# Patient Record
Sex: Female | Born: 1995 | Race: Black or African American | Hispanic: No | Marital: Single | Smoking: Never smoker
Health system: Southern US, Community
[De-identification: ages and names within clinical notes are randomized; demographics above are authoritative.]

## PROBLEM LIST (undated history)

## (undated) DIAGNOSIS — G43909 Migraine, unspecified, not intractable, without status migrainosus: Secondary | ICD-10-CM

## (undated) DIAGNOSIS — L709 Acne, unspecified: Secondary | ICD-10-CM

## (undated) DIAGNOSIS — R51 Headache: Secondary | ICD-10-CM

## (undated) DIAGNOSIS — L708 Other acne: Secondary | ICD-10-CM

## (undated) DIAGNOSIS — L309 Dermatitis, unspecified: Secondary | ICD-10-CM

## (undated) HISTORY — DX: Dermatitis, unspecified: L30.9

## (undated) HISTORY — DX: Headache: R51

## (undated) HISTORY — DX: Migraine, unspecified, not intractable, without status migrainosus: G43.909

## (undated) HISTORY — DX: Other acne: L70.8

## (undated) HISTORY — DX: Acne, unspecified: L70.9

## (undated) HISTORY — PX: NO PAST SURGERIES: SHX2092

---
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
## 2016-04-11 ENCOUNTER — Ambulatory Visit (INDEPENDENT_AMBULATORY_CARE_PROVIDER_SITE_OTHER): Payer: 59 | Admitting: Internal Medicine

## 2016-04-11 ENCOUNTER — Encounter: Payer: Self-pay | Admitting: Internal Medicine

## 2016-04-11 VITALS — BP 110/80 | HR 106 | Temp 97.9°F | Ht 66.82 in | Wt 217.8 lb

## 2016-04-11 DIAGNOSIS — R29898 Other symptoms and signs involving the musculoskeletal system: Secondary | ICD-10-CM | POA: Diagnosis not present

## 2016-04-11 DIAGNOSIS — M222X2 Patellofemoral disorders, left knee: Secondary | ICD-10-CM | POA: Diagnosis not present

## 2016-04-11 DIAGNOSIS — Z6834 Body mass index (BMI) 34.0-34.9, adult: Secondary | ICD-10-CM

## 2016-04-11 DIAGNOSIS — M25562 Pain in left knee: Secondary | ICD-10-CM

## 2016-04-11 NOTE — Patient Instructions (Signed)
Fortunately I do not think you have an internal ligament problem in the knee.  However I do think you have a patellofemoral tracking problem with possible disc with possible dislocation.  To prevent recurrent problems he may benefit from an exercise program and certain knee support.  For now use ice 10 minutes at night and then as needed  Can use ibuprofen 600 mg every 8 hours as needed for pain and swelling.  Avoid deep knee bends at this time.  Can try some gentle exercises as tolerated. If they hurt.  We'll do referral to sports medicine for the reasons discussed therapy and preventive exercise advice.    Patellar Dislocation and Subluxation, Phase I Rehab Ask your health care provider which exercises are safe for you. Do exercises exactly as told by your health care provider and adjust them as directed. It is normal to feel mild stretching, pulling, tightness, or discomfort as you do these exercises, but you should stop right away if you feel sudden pain or your pain gets worse.&nbsp;Do not begin these exercises until told by your health care provider. Stretching and range of motion exercises These exercises warm up your muscles and joints and improve the movement and flexibility of your knee. These exercises also help to relieve pain, numbness, and tingling. Exercise A: Knee flexion, active-assisted  2. Lie on your back with both knees straight. If this causes back discomfort, bend your healthy knee so your foot is flat on the floor. 3. Slowly slide your left / right heel back toward your buttocks as far as you can without feeling pain. 4. Hook your healthy leg to the top of your left / right shin and pull back to gently help your knee bend further. Do not force your knee to bend. 5. Hold for __________ seconds. 6. Slowly slide your left / right heel back to the starting position. Repeat __________ times. Complete this exercise __________ times a day. Strengthening  exercises These exercises build strength and endurance in your knee. Endurance is the ability to use your muscles for a long time, even after they get tired. If told by your health care provider, wear your brace while you do these exercises. Exercise B: Quadriceps, isometric   2. Lie on your back with your left / right leg extended and your other knee bent. If told by your health care provider, put a small pillow or rolled towel under your knee. 3. Slowly tense the muscles in the front of your left / right thigh. You should see your knee cap slide up toward your hip or see increased dimpling just above the knee. This motion will push the back of your knee toward the floor. 4. For ___________ seconds, hold the muscle as tight as you can without increasing your pain. 5. Relax the muscles slowly and completely. Repeat __________ times. Complete this exercise __________ times a day. Exercise C: Straight leg raises (  quadriceps) 2. Lie on your back with your left / right leg extended and your other knee bent. 3. Tense the muscles in the front of your left / right thigh. You should see your kneecap slide up or see increased dimpling just above your knee. Your thigh may even shake a bit. 4. Keep these muscles tight as you raise your leg 4-6 inches (10-15 cm) off the floor. 5. Hold for __________ seconds. 6. Keep these muscles tense as you lower your leg. 7. Relax the muscles slowly and completely. Repeat __________ times. Complete this  exercise __________ times a day. Exercise D: Straight leg raises ( hip abductors) 1. Lie on your side with your left / right leg in the top position. Lie so your head, shoulder, knee, and hip line up. You may bend your lower knee to help you keep your balance. 2. Roll your hips slightly forward so your hips are stacked directly over each other and your left / right knee is facing forward. 3. Leading with your heel, lift your top leg 4-6 inches (10-15 cm). You should  feel the muscles in your outer hip lifting.  Do not let your foot drift forward.  Do not let your knee roll toward the ceiling. 4. Hold this position for __________ seconds. 5. Slowly lower your leg to the starting position. 6. Let your muscles relax completely. Repeat __________ times. Complete this exercise __________ times a day. Exercise E: Straight leg raises ( hip extensors) 2. Lie on your abdomen on a firm surface. You can put a pillow under your hips if that is more comfortable. 3. Tense the muscles in your buttocks and lift your left / right leg about 4-6 inches (10-15 cm). Keep your knee straight as you lift your leg. 4. Hold this position for __________ seconds. 5. Slowly lower your leg to the starting position. 6. Let your leg relax completely. Repeat __________ times. Complete this exercise __________ times a day. Exercise F: Straight leg raises ( hip adductors) 2. Lie on your side with your left / right leg in the bottom position. Lie so your head, shoulder, knee, and hip line up. 3. Bend your top leg and place that foot in front of or behind your other leg to help you keep your balance. 4. Roll your hips slightly forward so your hips are stacked directly over each other and your knee is facing forward. 5. Tense the muscles in your inner thigh and lift your bottom leg 4-6 inches (10-15 cm). 6. Hold this position for __________ seconds. 7. Slowly lower your leg to the starting position. 8. Let your muscles relax completely. Repeat __________ times. Complete this exercise __________ times a day. This information is not intended to replace advice given to you by your health care provider. Make sure you discuss any questions you have with your health care provider. Document Released: 01/27/2005 Document Revised: 10/04/2015 Document Reviewed: 12/09/2014 Elsevier Interactive Patient Education  2017 [employer].

## 2016-04-11 NOTE — Progress Notes (Signed)
Chief Complaint  Patient presents with  . Knee Pain    HPI: Candice Wood 21 y.o.  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 Doesn't remember any specific injury but does do kneeling squatting and moving a lot. She had some discomfort in her left knee and then yesterday felt a pop over her left kneecap was used laying in bed and had to flip it back to normal. Become more sore and tender since that time. No previous history of injury and knee. No other treatment. ROS: See pertinent positives and negatives per HPI.  Past Medical History:  Diagnosis Date  . Acne   . ACNE 11/02/2007   Qualifier: Diagnosis of  By: Fabian Sharp MD, Neta Mends   . Eczema    neck  . Headache(784.0) 01/09/2010   Qualifier: Diagnosis of  By: Fabian Sharp MD, Neta Mends   . Migraine headache    eval peds neuro 2013 given zofran and  diary      Family History  Problem Relation Age of Onset  . Allergies Mother   . Migraines Father   . Asthma Paternal Grandmother   . Heart failure      pgm    Social History   Social History  . Marital status: Single    Spouse name: N/A  . Number of children: N/A  . Years of education: N/A   Social History Main Topics  . Smoking status: Never Smoker  . Smokeless tobacco: Never Used  . Alcohol use None  . Drug use: Unknown  . Sexual activity: Not Asked   Other Topics Concern  . None   Social History Narrative   Single   western  Ok in school    [employer] then wants to transfer.    2 households    No pets   Playing basketball school    Outpatient Medications Prior to Visit  Medication Sig Dispense Refill  . cyclobenzaprine (FLEXERIL) 5 MG tablet Take 1 tablet (5 mg total) by mouth at bedtime. For neck pain and headache 20 tablet 0  . levonorgestrel-ethinyl estradiol (AVIANE) 0.1-20 MG-MCG tablet Take 1 tablet by mouth daily. 1 Package 11  . naproxen (NAPROSYN) 500 MG tablet Take 1 po bid for headache with food 40 tablet 1   No  facility-administered medications prior to visit.      EXAM:  BP 110/80 (BP Location: Left Arm, Patient Position: Sitting, Cuff Size: Normal)   Pulse (!) 106   Temp 97.9 F (36.6 C) (Oral)   Ht 5' 6.82" (1.697 m)   Wt 217 lb 12.8 oz (98.8 kg)   BMI 34.30 kg/m   Body mass index is 34.3 kg/m.  GENERAL: vitals reviewed and listed above, alert, oriented, appears well hydrated and in no acute distress HEENT: atraumatic, conjunctiva  clear, no obvious abnormalities on inspection of external nose and ears   Examination of both knees left knee has some mild peripatellar effusion without warmth or crepitus. Tenderness in the medial patellar area range of motion normal negative drawer sign. Gait minimally antalgic. No posterior pain or joint line pain.    PSYCH: pleasant and cooperative, no obvious depression or anxiety Wt Readings from Last 3 Encounters:  04/11/16 217 lb 12.8 oz (98.8 kg) (99 %, Z= 2.18)*  11/01/15 211 lb 14.4 oz (96.1 kg) (98 %, Z= 2.11)*  09/29/14 192 lb (87.1 kg) (97 %, Z= 1.88)*   * Growth percentiles are based on  CDC 2-20 Years data.    ASSESSMENT AND PLAN:  Discussed the following assessment and plan:  Acute pain of left knee - Plan: Ambulatory referral to Sports Medicine  Patellofemoral pain syndrome of left knee - Plan: Ambulatory referral to Sports Medicine  Popping sound of knee joint - Plan: Ambulatory referral to Sports Medicine  BMI 34.0-34.9,adult May have had a mild patellar subluxation based on history. Discussed situation patellofemoral problem. Ice gentle exercises anti-inflammatory avoiding deep knee bends at this time we'll also do sports medicine referral for rehabilitation advice bracing etc. when she's working. Let us know if there are problems that arise in the interim.  Discussed also weight reduction. As would be helpful for her future knee and general health. health. -Patient advised to return or notify health care team  if symptoms  worsen ,persist or new concerns arise.  Patient Instructions   Fortunately I do not think you have an internal ligament problem in the knee.  However I do think you have a patellofemoral tracking problem with possible disc with possible dislocation.  To prevent recurrent problems he may benefit from an exercise program and certain knee support.  For now use ice 10 minutes at night and then as needed  Can use ibuprofen 600 mg every 8 hours as needed for pain and swelling.  Avoid deep knee bends at this time.  Can try some gentle exercises as tolerated. If they hurt.  We'll do referral to sports medicine for the reasons discussed therapy and preventive exercise advice.    Patellar Dislocation and Subluxation, Phase I Rehab Ask your health care provider which exercises are safe for you. Do exercises exactly as told by your health care provider and adjust them as directed. It is normal to feel mild stretching, pulling, tightness, or discomfort as you do these exercises, but you should stop right away if you feel sudden pain or your pain gets worse. Do not begin these exercises until told by your health care provider. Stretching and range of motion exercises These exercises warm up your muscles and joints and improve the movement and flexibility of your knee. These exercises also help to relieve pain, numbness, and tingling. Exercise A: Knee flexion, active-assisted  2. Lie on your back with both knees straight. If this causes back discomfort, bend your healthy knee so your foot is flat on the floor. 3. Slowly slide your left / right heel back toward your buttocks as far as you can without feeling pain. 4. Hook your healthy leg to the top of your left / right shin and pull back to gently help your knee bend further. Do not force your knee to bend. 5. Hold for __________ seconds. 6. Slowly slide your left / right heel back to the starting position. Repeat __________ times. Complete this  exercise __________ times a day. Strengthening exercises These exercises build strength and endurance in your knee. Endurance is the ability to use your muscles for a long time, even after they get tired. If told by your health care provider, wear your brace while you do these exercises. Exercise B: Quadriceps, isometric   2. Lie on your back with your left / right leg extended and your other knee bent. If told by your health care provider, put a small pillow or rolled towel under your knee. 3. Slowly tense the muscles in the front of your left / right thigh. You should see your knee cap slide up toward your hip or see increased dimpling just  above the knee. This motion will push the back of your knee toward the floor. 4. For ___________ seconds, hold the muscle as tight as you can without increasing your pain. 5. Relax the muscles slowly and completely. Repeat __________ times. Complete this exercise __________ times a day. Exercise C: Straight leg raises (  quadriceps) 2. Lie on your back with your left / right leg extended and your other knee bent. 3. Tense the muscles in the front of your left / right thigh. You should see your kneecap slide up or see increased dimpling just above your knee. Your thigh may even shake a bit. 4. Keep these muscles tight as you raise your leg 4-6 inches (10-15 cm) off the floor. 5. Hold for __________ seconds. 6. Keep these muscles tense as you lower your leg. 7. Relax the muscles slowly and completely. Repeat __________ times. Complete this exercise __________ times a day. Exercise D: Straight leg raises ( hip abductors) 1. Lie on your side with your left / right leg in the top position. Lie so your head, shoulder, knee, and hip line up. You may bend your lower knee to help you keep your balance. 2. Roll your hips slightly forward so your hips are stacked directly over each other and your left / right knee is facing forward. 3. Leading with your heel, lift  your top leg 4-6 inches (10-15 cm). You should feel the muscles in your outer hip lifting.  Do not let your foot drift forward.  Do not let your knee roll toward the ceiling. 4. Hold this position for __________ seconds. 5. Slowly lower your leg to the starting position. 6. Let your muscles relax completely. Repeat __________ times. Complete this exercise __________ times a day. Exercise E: Straight leg raises ( hip extensors) 2. Lie on your abdomen on a firm surface. You can put a pillow under your hips if that is more comfortable. 3. Tense the muscles in your buttocks and lift your left / right leg about 4-6 inches (10-15 cm). Keep your knee straight as you lift your leg. 4. Hold this position for __________ seconds. 5. Slowly lower your leg to the starting position. 6. Let your leg relax completely. Repeat __________ times. Complete this exercise __________ times a day. Exercise F: Straight leg raises ( hip adductors) 2. Lie on your side with your left / right leg in the bottom position. Lie so your head, shoulder, knee, and hip line up. 3. Bend your top leg and place that foot in front of or behind your other leg to help you keep your balance. 4. Roll your hips slightly forward so your hips are stacked directly over each other and your knee is facing forward. 5. Tense the muscles in your inner thigh and lift your bottom leg 4-6 inches (10-15 cm). 6. Hold this position for __________ seconds. 7. Slowly lower your leg to the starting position. 8. Let your muscles relax completely. Repeat __________ times. Complete this exercise __________ times a day. This information is not intended to replace advice given to you by your health care provider. Make sure you discuss any questions you have with your health care provider. Document Released: 01/27/2005 Document Revised: 10/04/2015 Document Reviewed: 12/09/2014 Elsevier Interactive Patient Education  2017 [employer]      Sand SpringsWanda K.  Arseniy Toomey M.D.

## 2016-04-23 NOTE — Progress Notes (Deleted)
Candice Wood D.O. [facility] [address] [place], [place] [address] Phone: [phone] Subjective:    I'm seeing this patient by the request  of:  Lorretta HarpPANOSH,WANDA KOTVAN, MD   CC: Left knee pain  BJY:NWGNFAOZHYHPI:Subjective  Candice Wood is a 21 y.o. female coming in with complaint of left knee pain. Patient's has started a new job in the last month. Patient does not have any true injury but does do much more active motion including squatting and kneeling moving a lot of heavier equipment. Patient states that she felt a pop over the left knee On the anterior aspect of the knee. Patient states this happen when she was laying in bed. Became more sore and tender over the course of time. Patient states     Past Medical History:  Diagnosis Date  . Acne   . ACNE 11/02/2007   Qualifier: Diagnosis of  By: Fabian SharpPanosh MD, Neta MendsWanda K   . Eczema    neck  . Headache(784.0) 01/09/2010   Qualifier: Diagnosis of  By: Fabian SharpPanosh MD, Neta MendsWanda K   . Migraine headache    eval peds neuro 2013 given zofran and  diary     Past Surgical History:  Procedure Laterality Date  . NO PAST SURGERIES     Social History   Social History  . Marital status: Single    Spouse name: N/A  . Number of children: N/A  . Years of education: N/A   Social History Main Topics  . Smoking status: Never Smoker  . Smokeless tobacco: Never Used  . Alcohol use Not on file  . Drug use: Unknown  . Sexual activity: Not on file   Other Topics Concern  . Not on file   Social History Narrative   Single   western  Ok in school    [employer] then wants to transfer.    2 households    No pets   Playing basketball school   No Known Allergies Family History  Problem Relation Age of Onset  . Allergies Mother   . Migraines Father   . Asthma Paternal Grandmother   . Heart failure      pgm    Past medical history, social, surgical and family history all reviewed in electronic medical record.  No pertanent information unless stated  regarding to the chief complaint.   Review of Systems:Review of systems updated and as accurate as of 04/23/16  No headache, visual changes, nausea, vomiting, diarrhea, constipation, dizziness, abdominal pain, skin rash, fevers, chills, night sweats, weight loss, swollen lymph nodes, body aches, joint swelling, muscle aches, chest pain, shortness of breath, mood changes.   Objective  There were no vitals taken for this visit. Systems examined below as of 04/23/16   General: No apparent distress alert and oriented x3 mood and affect normal, dressed appropriately.  HEENT: Pupils equal, extraocular movements intact  Respiratory: Patient's speak in full sentences and does not appear short of breath  Cardiovascular: No lower extremity edema, non tender, no erythema  Skin: Warm dry intact with no signs of infection or rash on extremities or on axial skeleton.  Abdomen: Soft nontender  Neuro: Cranial nerves II through XII are intact, neurovascularly intact in all extremities with 2+ DTRs and 2+ pulses.  Lymph: No lymphadenopathy of posterior or anterior cervical chain or axillae bilaterally.  Gait normal with good balance and coordination.  MSK:  Non tender with full range of motion and good stability and symmetric strength and tone of  shoulders, elbows, wrist, hip, and ankles bilaterally.  Knee:left  Normal to inspection with no erythema or effusion or obvious bony abnormalities. Palpation normal with no warmth, joint line tenderness, patellar tenderness, or condyle tenderness. ROM full in flexion and extension and lower leg rotation. Ligaments with solid consistent endpoints including ACL, PCL, LCL, MCL. Negative Mcmurray's, Apley's, and Thessalonian tests. Non painful patellar compression. Patellar glide without crepitus. Patellar and quadriceps tendons unremarkable. Hamstring and quadriceps strength is normal.   MSK US performed of: *** This study was ordered, performed, and interpreted  by Terrilee Files D.O.  Knee: All structures visualized. Anteromedial, anterolateral, posteromedial, and posterolateral menisci unremarkable without tearing, fraying, effusion, or displacement. Patellar Tendon unremarkable on long and transverse views without effusion. No abnormality of prepatellar bursa. LCL and MCL unremarkable on long and transverse views. No abnormality of origin of medial or lateral head of the gastrocnemius.  IMPRESSION:  NORMAL ULTRASONOGRAPHIC EXAMINATION OF THE KNEE.    Impression and Recommendations:     This case required medical decision making of moderate complexity.      Note: This dictation was prepared with Dragon dictation along with smaller phrase technology. Any transcriptional errors that result from this process are unintentional.

## 2016-04-24 ENCOUNTER — Ambulatory Visit: Payer: 59 | Admitting: Family Medicine

## 2016-06-04 ENCOUNTER — Ambulatory Visit (INDEPENDENT_AMBULATORY_CARE_PROVIDER_SITE_OTHER): Payer: 59 | Admitting: Internal Medicine

## 2016-06-04 ENCOUNTER — Encounter: Payer: Self-pay | Admitting: Emergency Medicine

## 2016-06-04 ENCOUNTER — Encounter: Payer: Self-pay | Admitting: Internal Medicine

## 2016-06-04 VITALS — BP 110/80 | HR 92 | Temp 99.7°F | Ht 66.82 in | Wt 216.7 lb

## 2016-06-04 DIAGNOSIS — J029 Acute pharyngitis, unspecified: Secondary | ICD-10-CM

## 2016-06-04 DIAGNOSIS — J989 Respiratory disorder, unspecified: Secondary | ICD-10-CM

## 2016-06-04 DIAGNOSIS — R509 Fever, unspecified: Secondary | ICD-10-CM | POA: Diagnosis not present

## 2016-06-04 LAB — POCT INFLUENZA A/B
INFLUENZA B, POC: NEGATIVE
Influenza A, POC: NEGATIVE

## 2016-06-04 LAB — POCT RAPID STREP A (OFFICE): RAPID STREP A SCREEN: NEGATIVE

## 2016-06-04 NOTE — Patient Instructions (Signed)
This is probabaly a virqal respiratory infection that should run its course  Ok to use advil tyleonol for fever   Fluids  And gargles  . Cold meds for comfort   feve should be gone in 48 hours    Rest  Better  After the weekend contact us if  persistent or progressive pain etc .  Will be informed about  Strep culture

## 2016-06-04 NOTE — Progress Notes (Signed)
Chief Complaint  Patient presents with  . Sore Throat    started yesterday/ tried nyquil   . Cough  . Nausea  . Headache    HPI: Candice Wood 21 y.o.  sda  Onset about 48 hours or less nausea and then sore throat different than what she's had like something in her throat hurts to swallow in the nasal congestion fever and chills. Took NyQuil last night has some type of cough not severe. No vomiting at the end of her period. No specific exposures to discussed flu or strep. No shortness of breath unusual rashes. ROS: See pertinent positives and negatives per HPI.  Past Medical History:  Diagnosis Date  . Acne   . ACNE 11/02/2007   Qualifier: Diagnosis of  By: Fabian Sharp MD, Neta Mends   . Eczema    neck  . Headache(784.0) 01/09/2010   Qualifier: Diagnosis of  By: Fabian Sharp MD, Neta Mends   . Migraine headache    eval peds neuro 2013 given zofran and  diary      Family History  Problem Relation Age of Onset  . Allergies Mother   . Migraines Father   . Asthma Paternal Grandmother   . Heart failure      pgm    Social History   Social History  . Marital status: Single    Spouse name: N/A  . Number of children: N/A  . Years of education: N/A   Social History Main Topics  . Smoking status: Never Smoker  . Smokeless tobacco: Never Used  . Alcohol use None  . Drug use: Unknown  . Sexual activity: Not Asked   Other Topics Concern  . None   Social History Narrative   Single   western  Ok in school    [employer] then wants to transfer.    2 households    No pets   Playing basketball school    Outpatient Medications Prior to Visit  Medication Sig Dispense Refill  . levonorgestrel-ethinyl estradiol (AVIANE) 0.1-20 MG-MCG tablet Take 1 tablet by mouth daily. 1 Package 11  . cyclobenzaprine (FLEXERIL) 5 MG tablet Take 1 tablet (5 mg total) by mouth at bedtime. For neck pain and headache 20 tablet 0  . naproxen (NAPROSYN) 500 MG tablet Take 1 po bid for headache with food 40  tablet 1   No facility-administered medications prior to visit.      EXAM:  BP 110/80 (BP Location: Right Arm, Patient Position: Sitting, Cuff Size: Normal)   Pulse 92   Temp 99.7 F (37.6 C) (Oral)   Ht 5' 6.82" (1.697 m)   Wt 216 lb 11.2 oz (98.3 kg)   SpO2 98%   BMI 34.12 kg/m   Body mass index is 34.12 kg/m.  GENERAL: vitals reviewed and listed above, alert, oriented, appears well hydrated and in no acute distress very congested non toxic     HEENT: atraumatic, conjunctiva  clear, no obvious abnormalities on inspection of external nose and ears tm right pink slightly dull  Left nl  Nares boggy face tender bifrontal  OP : no lesion edema or exudate  2+ red  NECK: no obvious masses on inspection palpation  Tender ac nodes neg pc  LUNGS: clear to auscultation bilaterally, no wheezes, rales or rhonchi, good air movement CV: HRRR, no clubbing cyanosis or  peripheral edema nl cap refill  MS: moves all extremities without noticeable focal  abnormality PSYCH: pleasant and cooperative, no obvious depression or anxiety  ASSESSMENT AND PLAN:  Discussed the following assessment and plan:  Acute pharyngitis, unspecified etiology - Plan: POCT Influenza A/B, POCT rapid strep A, Culture, Group A Strep  Febrile respiratory illness - Plan: POCT Influenza A/B, POCT rapid strep A, Culture, Group A Strep  -Patient advised to return or notify health care team  if symptoms worsen ,persist or new concerns arise.  Patient Instructions  This is probabaly a virqal respiratory infection that should run its course  Ok to use advil tyleonol for fever   Fluids  And gargles  . Cold meds for comfort   feve should be gone in 48 hours    Rest  Better  After the weekend contact us if  persistent or progressive pain etc .  Will be informed about  Strep culture     Neta Mends. Panosh M.D.

## 2016-06-06 LAB — CULTURE, GROUP A STREP

## 2016-06-10 ENCOUNTER — Encounter: Payer: Self-pay | Admitting: Family Medicine

## 2017-01-17 ENCOUNTER — Other Ambulatory Visit: Payer: Self-pay | Admitting: Internal Medicine

## 2017-02-17 NOTE — Progress Notes (Signed)
Chief Complaint  Patient presents with  . Annual Exam    No new concerns. restart BC    HPI: Patient  Candice Wood  22 y.o. comes in today for Preventive Health Care visit   Stopped temp to see  If   weight related .    Since summer.   But wants to go back on ocps for period reg and contraception  Uses condoms  No sx  gu  Beginning more exercises      Health Maintenance  Topic Date Due  . HIV Screening  11/18/2011  . INFLUENZA VACCINE  10/22/2017 (Originally 09/10/2016)  . TETANUS/TDAP  08/19/2017   Health Maintenance Review LIFESTYLE:  Exercise:   Working out .  Tobacco/ETS: no Alcohol:   no Sugar beverages: Sleep: ave 6 hours  Drug use: no   HH of   3  No pets  Work:/school  Bed  [place]  .  25  30  No sig depression Period on now .  Monthly 7 days.     ROS:  GEN/ HEENT: No fever, significant weight changes sweats headaches vision problems hearing changes, CV/ PULM; No chest pain shortness of breath cough, syncope,edema  change in exercise tolerance. GI /GU: No adominal pain, vomiting, change in bowel habits. No blood in the stool. No significant GU symptoms. SKIN/HEME: ,no acute skin rashes suspicious lesions or bleeding. No lymphadenopathy, nodules, masses.  NEURO/ PSYCH:  No neurologic signs such as weakness numbness. No depression anxiety. IMM/ Allergy: No unusual infections.  Allergy .   REST of 12 system review negative except as per HPI   Past Medical History:  Diagnosis Date  . Acne   . ACNE 11/02/2007   Qualifier: Diagnosis of  By: Regis Bill MD, Standley Brooking   . Eczema    neck  . Headache(784.0) 01/09/2010   Qualifier: Diagnosis of  By: Regis Bill MD, Standley Brooking   . Migraine headache    eval peds neuro 2013 given zofran and  diary      Past Surgical History:  Procedure Laterality Date  . NO PAST SURGERIES      Family History  Problem Relation Age of Onset  . Allergies Mother   . Migraines Father   . Asthma Paternal Grandmother   . Heart  failure Unknown        pgm    Social History   Socioeconomic History  . Marital status: Single    Spouse name: Not on file  . Number of children: Not on file  . Years of education: Not on file  . Highest education level: Not on file  Social Needs  . Financial resource strain: Not on file  . Food insecurity - worry: Not on file  . Food insecurity - inability: Not on file  . Transportation needs - medical: Not on file  . Transportation needs - non-medical: Not on file  Occupational History  . Not on file  Tobacco Use  . Smoking status: Never Smoker  . Smokeless tobacco: Never Used  Substance and Sexual Activity  . Alcohol use: Not on file  . Drug use: Not on file  . Sexual activity: Not on file  Other Topics Concern  . Not on file  Social History Narrative   Single   [place] in school    [employer] then wants to transfer.    2 households    No pets   Playing basketball school    Outpatient Medications Prior  to Visit  Medication Sig Dispense Refill  . levonorgestrel-ethinyl estradiol (SRONYX) 0.1-20 MG-MCG tablet Take 1 tablet by mouth daily. NEED OFFICE VISIT FOR FURTHER REFILLS. (Patient not taking: Reported on 02/19/2017) 28 tablet 1   No facility-administered medications prior to visit.      EXAM:  BP 108/62 (BP Location: Right Arm, Patient Position: Sitting, Cuff Size: Normal)   Pulse 90   Temp 98.2 F (36.8 C) (Oral)   Ht 5' 5.5" (1.664 m)   Wt 220 lb 14.4 oz (100.2 kg)   BMI 36.20 kg/m   Body mass index is 36.2 kg/m. Wt Readings from Last 3 Encounters:  02/19/17 220 lb 14.4 oz (100.2 kg)  06/04/16 216 lb 11.2 oz (98.3 kg) (98 %, Z= 2.17)*  04/11/16 217 lb 12.8 oz (98.8 kg) (99 %, Z= 2.18)*   * Growth percentiles are based on CDC (Girls, 2-20 Years) data.    Physical Exam: Vital signs reviewed MIW:OEHO is a well-developed well-nourished alert cooperative    who appearsr stated age in no acute distress.  HEENT: normocephalic atraumatic , Eyes:  PERRL EOM's full, conjunctiva clear, Nares: paten,t no deformity discharge or tenderness., Ears: no deformity EAC's clear TMs with normal landmarks. Mouth: clear OP, no lesions, edema.  Moist mucous membranes. Dentition in adequate repair. NECK: supple without masses, thyromegaly or bruits. CHEST/PULM:  Clear to auscultation and percussion breath sounds equal no wheeze , rales or rhonchi. No chest wall deformities or tenderness. Breast: normal by inspection . No dimpling, discharge, masses, tenderness or discharge . CV: PMI is nondisplaced, S1 S2 no gallops, murmurs, rubs. Peripheral pulses are full without delay.No JVD .  ABDOMEN: Bowel sounds normal nontender  No guard or rebound, no hepato splenomegal no CVA tenderness.  No hernia. Extremtities:  No clubbing cyanosis or edema, no acute joint swelling or redness no focal atrophy NEURO:  Oriented x3, cranial nerves 3-12 appear to be intact, no obvious focal weakness,gait within normal limits no abnormal reflexes or asymmetrical SKIN: No acute rashes normal turgor, color, no bruising or petechiae. PSYCH: Oriented, good eye contact, no obvious depression anxiety, cognition and judgment appear normal. LN: no cervical axillary inguinal adenopathy    BP Readings from Last 3 Encounters:  02/19/17 108/62  06/04/16 110/80 (32 %, Z = -0.47 /  93 %, Z = 1.48)*  04/11/16 110/80 (33 %, Z = -0.43 /  93 %, Z = 1.47)*   *BP percentiles are based on the August 2017 AAP Clinical Practice Guideline for girls    Lab  Orders   sti screen etc   ASSESSMENT AND PLAN:  Discussed the following assessment and plan:  Visit for preventive health examination - Plan: Basic metabolic panel, CBC with Differential/Platelet, Hepatic function panel, Lipid panel, TSH, RPR, HIV antibody, C. trachomatis/N. gonorrhoeae RNA, CANCELED: GC/Chlamydia Probe Amp  OCP (oral contraceptive pills) initiation - acceptable candidate  to restart  - Plan: Basic metabolic panel, CBC with  Differential/Platelet, Hepatic function panel, Lipid panel, TSH, RPR, HIV antibody, C. trachomatis/N. gonorrhoeae RNA, CANCELED: GC/Chlamydia Probe Amp  Routine screening for STI (sexually transmitted infection) - Plan: Basic metabolic panel, CBC with Differential/Platelet, Hepatic function panel, Lipid panel, TSH, RPR, HIV antibody, C. trachomatis/N. gonorrhoeae RNA, CANCELED: GC/Chlamydia Probe Amp  Need for HPV vaccination - Plan: HPV 9-valent vaccine,Recombinat disc prevention   See below  Patient Care Team: Burnis Medin, MD as PCP - General Patient Instructions  Consider weight wtachers  As discussed o nline or meetings.  Begin  ocps   Will notify you  of labs when available.   rov in 4 months  For 3rd HPV  And Bo ocp check      Preventive Care for [place], Female The transition to life after high school as a young adult can be a stressful time with many changes. You may start seeing a primary care physician instead of a pediatrician. This is the time when your health care becomes your responsibility. Preventive care refers to lifestyle choices and visits with your health care provider that can promote health and wellness. What does preventive care include?  A yearly physical exam. This is also called an annual wellness visit.  Dental exams once or twice a year.  Routine eye exams. Ask your health care provider how often you should have your eyes checked.  Personal lifestyle choices, including: ? Daily care of your teeth and gums. ? Regular physical activity. ? Eating a healthy diet. ? Avoiding tobacco and drug use. ? Avoiding or limiting alcohol use. ? Practicing safe sex. ? Taking vitamin and mineral supplements as recommended by your health care provider. What happens during an annual wellness visit? Preventive care starts with a yearly visit to your primary care physician. The services and screenings done by your health care provider during your annual wellness  visit will depend on your overall health, lifestyle risk factors, and family history of disease. Counseling Your health care provider may ask you questions about:  Past medical problems and your family's medical history.  Medicines or supplements you take.  Health insurance and access to health care.  Alcohol, tobacco, and drug use.  Your safety at home, work, or school.  Access to firearms.  Emotional well-being and how you cope with stress.  Relationship well-being.  Diet, exercise, and sleep habits.  Your sexual health and activity.  Your methods of birth control.  Your menstrual cycle.  Your pregnancy history.  Screening You may have the following tests or measurements:  Height, weight, and BMI.  Blood pressure.  Lipid and cholesterol levels.  Tuberculosis skin test.  Skin exam.  Vision and hearing tests.  Screening test for hepatitis.  Screening tests for sexually transmitted diseases (STDs), if you are at risk.  BRCA-related cancer screening. This may be done if you have a family history of breast, ovarian, tubal, or peritoneal cancers.  Pelvic exam and Pap test. This may be done every 3 years starting at age 37.  Vaccines Your health care provider may recommend certain vaccines, such as:  Influenza vaccine. This is recommended every year.  Tetanus, diphtheria, and acellular pertussis (Tdap, Td) vaccine. You may need a Td booster every 10 years.  Varicella vaccine. You may need this if you have not been vaccinated.  HPV vaccine. If you are 29 or younger, you may need three doses over 6 months.  Measles, mumps, and rubella (MMR) vaccine. You may need at least one dose of MMR. You may also need a second dose.  Pneumococcal 13-valent conjugate (PCV13) vaccine. You may need this if you have certain conditions and were not previously vaccinated.  Pneumococcal polysaccharide (PPSV23) vaccine. You may need one or two doses if you smoke cigarettes or  if you have certain conditions.  Meningococcal vaccine. One dose is recommended if you are age 21-21 years and a first-year college student living in a residence hall, or if you have one of several medical conditions. You may also need additional booster doses.  Hepatitis A vaccine. You may  need this if you have certain conditions or if you travel or work in places where you may be exposed to hepatitis A.  Hepatitis B vaccine. You may need this if you have certain conditions or if you travel or work in places where you may be exposed to hepatitis B.  Haemophilus influenzae type b (Hib) vaccine. You may need this if you have certain risk factors.  Talk to your health care provider about which screenings and vaccines you need and how often you need them. What steps can I take to develop healthy behaviors?  Have regular preventive health care visits with your primary care physician and dentist.  Eat a healthy diet.  Drink enough fluid to keep your urine clear or pale yellow.  Stay active. Exercise at least 30 minutes 5 or more days of the week.  Use alcohol responsibly.  Maintain a healthy weight.  Do not use any products that contain nicotine, such as cigarettes, chewing tobacco, and e-cigarettes. If you need help quitting, ask your health care provider.  Do not use drugs.  Practice safe sex.  Use birth control (contraception) to prevent unwanted pregnancy. If you plan to become pregnant, see your health care provider for a pre-conception visit.  Find healthy ways to manage stress. How can I protect myself from injury? Injuries from violence or accidents are the leading cause of death among young adults and can often be prevented. Take these steps to help protect yourself:  Always wear your seat belt while driving or riding in a vehicle.  Do not drive if you have been drinking alcohol. Do not ride with someone who has been drinking.  Do not drive when you are tired or  distracted. Do not text while driving.  Wear a helmet and other protective equipment during sports activities.  If you have firearms in your house, make sure you follow all gun safety procedures.  Seek help if you have been bullied, physically abused, or sexually abused.  Use the Internet responsibly to avoid dangers such as online bullying and online sexual predators.  What can I do to cope with stress? Young adults may face many new challenges that can be stressful, such as finding a job, going to college, moving away from home, managing money, being in a relationship, getting married, and having children. To manage stress:  Avoid known stressful situations when you can.  Exercise regularly.  Find a stress-reducing activity that works best for you. Examples include meditation, yoga, listening to music, or reading.  Spend time in nature.  Keep a journal to write about your stress and how you respond.  Talk to your health care provider about stress. He or she may suggest counseling.  Spend time with supportive friends or family.  Do not cope with stress by: ? Drinking alcohol or using drugs. ? Smoking cigarettes. ? Eating.  Where can I get more information? Learn more about preventive care and healthy habits from:  [place] and Gynecologists: KaraokeExchange.nl  U.S. Probation officer Task Force: StageSync.si  National Adolescent and [place]: StrategicRoad.nl  American Academy of Pediatrics Bright Futures: https://brightfutures.MemberVerification.co.za  Society for Adolescent Health and Medicine: MoralBlog.co.za.aspx  PodExchange.nl: ToyLending.fr  This information is not intended to replace advice given to you  by your health care provider. Make sure you discuss any questions you have with your health care provider. Document Released: 06/14/2015 Document Revised: 07/05/2015 Document Reviewed: 06/14/2015 Elsevier Interactive Patient Education  [employer].  Standley Brooking. Aiko Belko M.D.

## 2017-02-19 ENCOUNTER — Encounter: Payer: Self-pay | Admitting: Internal Medicine

## 2017-02-19 ENCOUNTER — Ambulatory Visit (INDEPENDENT_AMBULATORY_CARE_PROVIDER_SITE_OTHER): Payer: 59 | Admitting: Internal Medicine

## 2017-02-19 VITALS — BP 108/62 | HR 90 | Temp 98.2°F | Ht 65.5 in | Wt 220.9 lb

## 2017-02-19 DIAGNOSIS — Z23 Encounter for immunization: Secondary | ICD-10-CM

## 2017-02-19 DIAGNOSIS — Z113 Encounter for screening for infections with a predominantly sexual mode of transmission: Secondary | ICD-10-CM | POA: Diagnosis not present

## 2017-02-19 DIAGNOSIS — Z30011 Encounter for initial prescription of contraceptive pills: Secondary | ICD-10-CM | POA: Diagnosis not present

## 2017-02-19 DIAGNOSIS — Z0000 Encounter for general adult medical examination without abnormal findings: Secondary | ICD-10-CM | POA: Diagnosis not present

## 2017-02-19 LAB — CBC WITH DIFFERENTIAL/PLATELET
BASOS ABS: 0 10*3/uL (ref 0.0–0.1)
Basophils Relative: 0.4 % (ref 0.0–3.0)
EOS PCT: 1.5 % (ref 0.0–5.0)
Eosinophils Absolute: 0.1 10*3/uL (ref 0.0–0.7)
HEMATOCRIT: 39.2 % (ref 36.0–46.0)
Hemoglobin: 12.5 g/dL (ref 12.0–15.0)
LYMPHS ABS: 1.7 10*3/uL (ref 0.7–4.0)
LYMPHS PCT: 19.5 % (ref 12.0–46.0)
MCHC: 31.9 g/dL (ref 30.0–36.0)
MCV: 84.8 fl (ref 78.0–100.0)
MONOS PCT: 10.1 % (ref 3.0–12.0)
Monocytes Absolute: 0.9 10*3/uL (ref 0.1–1.0)
NEUTROS ABS: 5.8 10*3/uL (ref 1.4–7.7)
NEUTROS PCT: 68.5 % (ref 43.0–77.0)
PLATELETS: 239 10*3/uL (ref 150.0–400.0)
RBC: 4.62 Mil/uL (ref 3.87–5.11)
RDW: 14.2 % (ref 11.5–14.6)
WBC: 8.5 10*3/uL (ref 4.5–10.5)

## 2017-02-19 LAB — BASIC METABOLIC PANEL
BUN: 11 mg/dL (ref 6–23)
CO2: 27 meq/L (ref 19–32)
Calcium: 9.1 mg/dL (ref 8.4–10.5)
Chloride: 105 mEq/L (ref 96–112)
Creatinine, Ser: 0.76 mg/dL (ref 0.40–1.20)
GFR: 124.45 mL/min (ref >60.00)
GLUCOSE: 91 mg/dL (ref 70–99)
Potassium: 3.9 mEq/L (ref 3.5–5.1)
SODIUM: 139 meq/L (ref 135–145)

## 2017-02-19 LAB — HEPATIC FUNCTION PANEL
ALBUMIN: 4.1 g/dL (ref 3.5–5.2)
ALK PHOS: 51 U/L (ref 39–117)
ALT: 9 U/L (ref 0–35)
AST: 11 U/L (ref 0–37)
Bilirubin, Direct: 0.1 mg/dL (ref 0.0–0.3)
TOTAL PROTEIN: 6.8 g/dL (ref 6.0–8.3)
Total Bilirubin: 0.5 mg/dL (ref 0.2–1.2)

## 2017-02-19 LAB — LIPID PANEL
CHOLESTEROL: 131 mg/dL (ref 0–200)
HDL: 50.8 mg/dL (ref >39.00)
LDL Cholesterol: 72 mg/dL (ref 0–99)
NONHDL: 80.61
Total CHOL/HDL Ratio: 3
Triglycerides: 44 mg/dL (ref 0.0–149.0)
VLDL: 8.8 mg/dL (ref 0.0–40.0)

## 2017-02-19 LAB — TSH: TSH: 2.47 u[IU]/mL (ref 0.35–5.50)

## 2017-02-19 MED ORDER — DESOGESTREL-ETHINYL ESTRADIOL 0.15-30 MG-MCG PO TABS: 1.0000 | ORAL_TABLET | Freq: Every day | ORAL | 11 refills | Status: DC

## 2017-02-19 NOTE — Patient Instructions (Signed)
Consider weight wtachers  As discussed o nline or meetings.  Begin ocps   Will notify you  of labs when available.   rov in 4 months  For 3rd HPV  And Bo ocp check      Preventive Care for Young Adults, Female The transition to life after high school as a young adult can be a stressful time with many changes. You may start seeing a primary care physician instead of a pediatrician. This is the time when your health care becomes your responsibility. Preventive care refers to lifestyle choices and visits with your health care provider that can promote health and wellness. What does preventive care include?  A yearly physical exam. This is also called an annual wellness visit.  Dental exams once or twice a year.  Routine eye exams. Ask your health care provider how often you should have your eyes checked.  Personal lifestyle choices, including: ? Daily care of your teeth and gums. ? Regular physical activity. ? Eating a healthy diet. ? Avoiding tobacco and drug use. ? Avoiding or limiting alcohol use. ? Practicing safe sex. ? Taking vitamin and mineral supplements as recommended by your health care provider. What happens during an annual wellness visit? Preventive care starts with a yearly visit to your primary care physician. The services and screenings done by your health care provider during your annual wellness visit will depend on your overall health, lifestyle risk factors, and family history of disease. Counseling Your health care provider may ask you questions about:  Past medical problems and your family's medical history.  Medicines or supplements you take.  Health insurance and access to health care.  Alcohol, tobacco, and drug use.  Your safety at home, work, or school.  Access to firearms.  Emotional well-being and how you cope with stress.  Relationship well-being.  Diet, exercise, and sleep habits.  Your sexual health and activity.  Your methods of birth  control.  Your menstrual cycle.  Your pregnancy history.  Screening You may have the following tests or measurements:  Height, weight, and BMI.  Blood pressure.  Lipid and cholesterol levels.  Tuberculosis skin test.  Skin exam.  Vision and hearing tests.  Screening test for hepatitis.  Screening tests for sexually transmitted diseases (STDs), if you are at risk.  BRCA-related cancer screening. This may be done if you have a family history of breast, ovarian, tubal, or peritoneal cancers.  Pelvic exam and Pap test. This may be done every 3 years starting at age 21.  Vaccines Your health care provider may recommend certain vaccines, such as:  Influenza vaccine. This is recommended every year.  Tetanus, diphtheria, and acellular pertussis (Tdap, Td) vaccine. You may need a Td booster every 10 years.  Varicella vaccine. You may need this if you have not been vaccinated.  HPV vaccine. If you are 26 or younger, you may need three doses over 6 months.  Measles, mumps, and rubella (MMR) vaccine. You may need at least one dose of MMR. You may also need a second dose.  Pneumococcal 13-valent conjugate (PCV13) vaccine. You may need this if you have certain conditions and were not previously vaccinated.  Pneumococcal polysaccharide (PPSV23) vaccine. You may need one or two doses if you smoke cigarettes or if you have certain conditions.  Meningococcal vaccine. One dose is recommended if you are age 19-21 years and a first-year college student living in a residence hall, or if you have one of several medical conditions. You may   also need additional booster doses.  Hepatitis A vaccine. You may need this if you have certain conditions or if you travel or work in places where you may be exposed to hepatitis A.  Hepatitis B vaccine. You may need this if you have certain conditions or if you travel or work in places where you may be exposed to hepatitis B.  Haemophilus influenzae  type b (Hib) vaccine. You may need this if you have certain risk factors.  Talk to your health care provider about which screenings and vaccines you need and how often you need them. What steps can I take to develop healthy behaviors?  Have regular preventive health care visits with your primary care physician and dentist.  Eat a healthy diet.  Drink enough fluid to keep your urine clear or pale yellow.  Stay active. Exercise at least 30 minutes 5 or more days of the week.  Use alcohol responsibly.  Maintain a healthy weight.  Do not use any products that contain nicotine, such as cigarettes, chewing tobacco, and e-cigarettes. If you need help quitting, ask your health care provider.  Do not use drugs.  Practice safe sex.  Use birth control (contraception) to prevent unwanted pregnancy. If you plan to become pregnant, see your health care provider for a pre-conception visit.  Find healthy ways to manage stress. How can I protect myself from injury? Injuries from violence or accidents are the leading cause of death among young adults and can often be prevented. Take these steps to help protect yourself:  Always wear your seat belt while driving or riding in a vehicle.  Do not drive if you have been drinking alcohol. Do not ride with someone who has been drinking.  Do not drive when you are tired or distracted. Do not text while driving.  Wear a helmet and other protective equipment during sports activities.  If you have firearms in your house, make sure you follow all gun safety procedures.  Seek help if you have been bullied, physically abused, or sexually abused.  Use the Internet responsibly to avoid dangers such as online bullying and online sexual predators.  What can I do to cope with stress? Young adults may face many new challenges that can be stressful, such as finding a job, going to college, moving away from home, managing money, being in a relationship, getting  married, and having children. To manage stress:  Avoid known stressful situations when you can.  Exercise regularly.  Find a stress-reducing activity that works best for you. Examples include meditation, yoga, listening to music, or reading.  Spend time in nature.  Keep a journal to write about your stress and how you respond.  Talk to your health care provider about stress. He or she may suggest counseling.  Spend time with supportive friends or family.  Do not cope with stress by: ? Drinking alcohol or using drugs. ? Smoking cigarettes. ? Eating.  Where can I get more information? Learn more about preventive care and healthy habits from:  [place] and Gynecologists: KaraokeExchange.nl  U.S. Probation officer Task Force: StageSync.si  National Adolescent and [place]: StrategicRoad.nl  American Academy of Pediatrics Bright Futures: https://brightfutures.MemberVerification.co.za  Society for Adolescent Health and Medicine: MoralBlog.co.za.aspx  PodExchange.nl: ToyLending.fr  This information is not intended to replace advice given to you by your health care provider. Make sure you discuss any questions you have with your health care provider. Document Released: 06/14/2015 Document Revised: 07/05/2015 Document  Reviewed: 06/14/2015 Elsevier Interactive Patient Education  2018 Elsevier Inc.  

## 2017-02-20 LAB — C. TRACHOMATIS/N. GONORRHOEAE RNA
C. TRACHOMATIS RNA, TMA: NOT DETECTED
N. gonorrhoeae RNA, TMA: NOT DETECTED

## 2017-02-20 LAB — HIV ANTIBODY (ROUTINE TESTING W REFLEX): HIV 1&2 Ab, 4th Generation: NONREACTIVE

## 2017-02-20 LAB — RPR: RPR Ser Ql: NONREACTIVE

## 2018-01-29 NOTE — Progress Notes (Signed)
No chief complaint on file.   HPI: Candice Wood 22 y.o. come in for   Problem based visit   Rash   Was at work [employer]  and after scrathing face had some  Whelp marks noted   Hx of  Itching  Area around skin. Washes and some ponds moisturizer  And sometimes  [place].  Not getting better  No exposures pets tinea  Last ov was PV jan 2019    Hx of eczema as a child  ROS: See pertinent positives and negatives per HPI. No other rashes  Does tend to have whelps when scratches otherwise   Past Medical History:  Diagnosis Date  . Acne   . ACNE 11/02/2007   Qualifier: Diagnosis of  By: Fabian SharpPanosh MD, Neta MendsWanda K   . Eczema    neck  . Headache(784.0) 01/09/2010   Qualifier: Diagnosis of  By: Fabian SharpPanosh MD, Neta MendsWanda K   . Migraine headache    eval peds neuro 2013 given zofran and  diary      Family History  Problem Relation Age of Onset  . Allergies Mother   . Migraines Father   . Asthma Paternal Grandmother   . Heart failure Unknown        pgm    Social History   Socioeconomic History  . Marital status: Single    Spouse name: Not on file  . Number of children: Not on file  . Years of education: Not on file  . Highest education level: Not on file  Occupational History  . Not on file  Social Needs  . Financial resource strain: Not on file  . Food insecurity:    Worry: Not on file    Inability: Not on file  . Transportation needs:    Medical: Not on file    Non-medical: Not on file  Tobacco Use  . Smoking status: Never Smoker  . Smokeless tobacco: Never Used  Substance and Sexual Activity  . Alcohol use: Not on file  . Drug use: Not on file  . Sexual activity: Not on file  Lifestyle  . Physical activity:    Days per week: Not on file    Minutes per session: Not on file  . Stress: Not on file  Relationships  . Social connections:    Talks on phone: Not on file    Gets together: Not on file    Attends religious service: Not on file    Active member of  club or organization: Not on file    Attends meetings of clubs or organizations: Not on file    Relationship status: Not on file  Other Topics Concern  . Not on file  Social History Narrative   Single   western  Ok in school    [employer] then wants to transfer.    2 households    No pets   Playing basketball school    Outpatient Medications Prior to Visit  Medication Sig Dispense Refill  . desogestrel-ethinyl estradiol (APRI,EMOQUETTE,SOLIA) 0.15-30 MG-MCG tablet Take 1 tablet by mouth daily. 1 Package 11  . levonorgestrel-ethinyl estradiol (SRONYX) 0.1-20 MG-MCG tablet Take 1 tablet by mouth daily. NEED OFFICE VISIT FOR FURTHER REFILLS. 28 tablet 1   No facility-administered medications prior to visit.      EXAM:  BP 108/70 (BP Location: Left Arm, Patient Position: Sitting, Cuff Size: Large)   Pulse 74   Temp 98 F (36.7 C)   Wt 222 lb 3.2  oz (100.8 kg)   SpO2 98%   BMI 36.41 kg/m   Body mass index is 36.41 kg/m.  GENERAL: vitals reviewed and listed above, alert, oriented, appears well hydrated and in no acute distress HEENT: atraumatic, conjunctiva  clear, no obvious abnormalities on inspection of external nose and ears  NECK: no obvious masses on inspection palpation  Skin  Left  Lower chin with 3 cm plaque hyperpigmented thickened with a few follicles noted  And indestinct  Margins   No central  clearing  Art nails   Hands clear  PSYCH: pleasant and cooperative, no obvious depression or anxiety  BP Readings from Last 3 Encounters:  02/01/18 108/70  02/19/17 108/62  06/04/16 110/80   Wt Readings from Last 3 Encounters:  02/01/18 222 lb 3.2 oz (100.8 kg)  02/19/17 220 lb 14.4 oz (100.2 kg)  06/04/16 216 lb 11.2 oz (98.3 kg) (98 %, Z= 2.17)*   * Growth percentiles are based on CDC (Girls, 2-20 Years) data.    ASSESSMENT AND PLAN:  Discussed the following assessment and plan:  Pruritic rash - see text poss eczema but atypical  try mild steroid   first  Influenza vaccination declined by patient Declined lfu vaccine -Patient advised to return or notify health care team  if  new concerns arise.  Patient Instructions  Treating this like an eczema patch   Topical  Steroid twice a day for up  To 2 weeks  I f not improving in 2 weeks then   Plan followup. We can consider seeing dermatologist if needed.   Avoid scratching   Cool compresses if needed.      Neta MendsWanda K. Leilanee Righetti M.D.

## 2018-02-01 ENCOUNTER — Encounter: Payer: Self-pay | Admitting: Internal Medicine

## 2018-02-01 ENCOUNTER — Ambulatory Visit: Payer: 59 | Admitting: Internal Medicine

## 2018-02-01 VITALS — BP 108/70 | HR 74 | Temp 98.0°F | Wt 222.2 lb

## 2018-02-01 DIAGNOSIS — Z2821 Immunization not carried out because of patient refusal: Secondary | ICD-10-CM | POA: Diagnosis not present

## 2018-02-01 DIAGNOSIS — L282 Other prurigo: Secondary | ICD-10-CM

## 2018-02-01 MED ORDER — DESONIDE 0.05 % EX CREA: TOPICAL_CREAM | Freq: Two times a day (BID) | CUTANEOUS | 1 refills | Status: AC

## 2018-02-01 NOTE — Patient Instructions (Signed)
Treating this like an eczema patch   Topical  Steroid twice a day for up  To 2 weeks  I f not improving in 2 weeks then   Plan followup. We can consider seeing dermatologist if needed.   Avoid scratching   Cool compresses if needed.

## 2018-08-27 ENCOUNTER — Ambulatory Visit (INDEPENDENT_AMBULATORY_CARE_PROVIDER_SITE_OTHER): Payer: 59 | Admitting: Internal Medicine

## 2018-08-27 ENCOUNTER — Encounter: Payer: Self-pay | Admitting: Internal Medicine

## 2018-08-27 DIAGNOSIS — F418 Other specified anxiety disorders: Secondary | ICD-10-CM

## 2018-08-27 DIAGNOSIS — Z0289 Encounter for other administrative examinations: Secondary | ICD-10-CM

## 2018-08-27 NOTE — Patient Instructions (Signed)
Health Maintenance Due  Topic Date Due  . TETANUS/TDAP  08/19/2017  . PAP-Cervical Cytology Screening  11/17/2017  . PAP SMEAR-Modifier  11/17/2017    Depression screen PHQ 2/9 02/19/2017  Decreased Interest 0  Down, Depressed, Hopeless 0  PHQ - 2 Score 0

## 2018-08-27 NOTE — Progress Notes (Signed)
Virtual Visit via Video Note  I connected with@ on 08/27/18 at  9:30 AM EDT by a video enabled telemedicine application and verified that I am speaking with the correct person using two identifiers. Location patient: home Location provider:work office Persons participating in the virtual visit: patient, provider  [place] national recommendations  regarding COVID 19 pandemic   video visit is advised over in office visit for this patient.  Patient aware  of the limitations of evaluation and management by telemedicine and  availability of in person appointments. and agreed to proceed.   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   Sleep 6 hours neg stad  Not too active with work.   Denies other medical sx and no depression  ROS: See pertinent positives and negatives per HPI.  Past Medical History:  Diagnosis Date  . Acne   . ACNE 11/02/2007   Qualifier: Diagnosis of  By: Fabian SharpPanosh MD, Neta MendsWanda K   . Eczema    neck  . Headache(784.0) 01/09/2010   Qualifier: Diagnosis of  By: Fabian SharpPanosh MD, Neta MendsWanda K   . Migraine headache    eval peds neuro 2013 given zofran and  diary      Past Surgical History:  Procedure Laterality Date  . NO PAST SURGERIES      Family History  Problem Relation Age of Onset  . Allergies Mother   . Migraines Father   . Asthma Paternal Grandmother   . Heart failure Other        pgm    Social History   Tobacco Use  . Smoking status: Never Smoker  . Smokeless tobacco: Never Used  Substance Use Topics  . Alcohol use: Not on file  . Drug use: Not on file      Current Outpatient Medications:  .  desonide (DESOWEN) 0.05 % cream, Apply topically 2 (two) times daily. To rash, Disp: 15 g, Rfl: 1  EXAM: BP Readings from Last 3 Encounters:  02/01/18  108/70  02/19/17 108/62  06/04/16 110/80    VITALS per patient if applicable:  GENERAL: alert, oriented, appears well and in no acute distress  HEENT: atraumatic, conjunttiva clear, no obvious abnormalities on inspection of external nose and ears NECK: normal movements of the head and neck LUNGS: on inspection no signs of respiratory distress, breathing rate appears normal, no obvious gross SOB, gasping or wheezing CV: no obvious cyanosis PSYCH/NEURO: pleasant and cooperative, no obvious depression or anxiety, speech and thought processing grossly intact Lab Results  Component Value Date   WBC 8.5 02/19/2017   HGB 12.5 02/19/2017   HCT 39.2 02/19/2017   PLT 239.0 02/19/2017   GLUCOSE 91 02/19/2017   CHOL 131 02/19/2017   TRIG 44.0 02/19/2017   HDL 50.80 02/19/2017   LDLCALC 72 02/19/2017   ALT 9 02/19/2017   AST 11 02/19/2017   NA 139 02/19/2017   K 3.9 02/19/2017   CL 105 02/19/2017   CREATININE 0.76 02/19/2017   BUN 11 02/19/2017   CO2 27 02/19/2017   TSH 2.47 02/19/2017   HGBA1C 5.7 03/03/2012    ASSESSMENT AND PLAN:  Discussed the following assessment and plan:    ICD-10-CM   1. 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 Shared Decision Making plan counseling  [facility]  phone numbers and plan  No complicating factors identified.  Counseled.  strategies options  And fu with  [place] if medication  Considered .   Expectant management and discussion of plan and treatment with opportunity to ask questions and all were answered. The patient agreed with the plan and demonstrated an understanding of the instructions.   Advised to call back or seek an in-person evaluation if worsening  or having  further concerns .  I provided 25 minutes of non-face-to-face time during this  encounter.   Shanon Ace, MD

## 2019-03-10 ENCOUNTER — Ambulatory Visit: Payer: 59 | Attending: Internal Medicine

## 2019-03-10 DIAGNOSIS — Z20822 Contact with and (suspected) exposure to covid-19: Secondary | ICD-10-CM

## 2019-03-11 ENCOUNTER — Telehealth: Payer: Self-pay | Admitting: *Deleted

## 2019-03-11 LAB — NOVEL CORONAVIRUS, NAA: SARS-CoV-2, NAA: NOT DETECTED

## 2019-03-11 NOTE — Telephone Encounter (Signed)
Patient called given negative covid results . 

## 2019-07-01 ENCOUNTER — Other Ambulatory Visit: Payer: Self-pay

## 2019-07-01 NOTE — Progress Notes (Signed)
Chief Complaint  Patient presents with  . Nevus    On right eyelid, growing and is painful at times  This visit occurred during the SARS-CoV-2 public health emergency.  Safety protocols were in place, including screening questions prior to the visit, additional usage of staff PPE, and extensive cleaning of exam room while observing appropriate contact time as indicated for disinfecting solutions.     HPI: Candice Wood 24 y.o. come in for growth on righ eye lid for a year or 2 but recently increase in size and gets irritated  No change vision or redness  a Last visit 7 20   anxiety ROS: See pertinent positives and negatives per HPI.   Past Medical History:  Diagnosis Date  . Acne   . ACNE 11/02/2007   Qualifier: Diagnosis of  By: Fabian Sharp MD, Neta Mends   . Eczema    neck  . Headache(784.0) 01/09/2010   Qualifier: Diagnosis of  By: Fabian Sharp MD, Neta Mends   . Migraine headache    eval peds neuro 2013 given zofran and  diary      Family History  Problem Relation Age of Onset  . Allergies Mother   . Migraines Father   . Asthma Paternal Grandmother   . Heart failure Other        pgm    Social History   Socioeconomic History  . Marital status: Single    Spouse name: Not on file  . Number of children: Not on file  . Years of education: Not on file  . Highest education level: Not on file  Occupational History  . Not on file  Tobacco Use  . Smoking status: Never Smoker  . Smokeless tobacco: Never Used  Substance and Sexual Activity  . Alcohol use: Yes    Alcohol/week: 2.0 standard drinks    Types: 1 Shots of liquor, 1 Glasses of wine per week    Comment: Occassionally  . Drug use: Never  . Sexual activity: Yes    Birth control/protection: Condom  Other Topics Concern  . Not on file  Social History Narrative   Single   western  Ok in school    [employer] then wants to transfer.    2 households    No pets   Playing basketball school   Social Determinants of Health    Financial Resource Strain:   . Difficulty of Paying Living Expenses:   Food Insecurity:   . Worried About Programme researcher, broadcasting/film/video in the Last Year:   . Barista in the Last Year:   Transportation Needs:   . Freight forwarder (Medical):   Marland Kitchen Lack of Transportation (Non-Medical):   Physical Activity:   . Days of Exercise per Week:   . Minutes of Exercise per Session:   Stress:   . Feeling of Stress :   Social Connections:   . Frequency of Communication with Friends and Family:   . Frequency of Social Gatherings with Friends and Family:   . Attends Religious Services:   . Active Member of Clubs or Organizations:   . Attends Banker Meetings:   Marland Kitchen Marital Status:     Outpatient Medications Prior to Visit  Medication Sig Dispense Refill  . desonide (DESOWEN) 0.05 % cream Apply topically 2 (two) times daily. To rash 15 g 1   No facility-administered medications prior to visit.     EXAM:  BP 116/72   Pulse 83   Temp 98.3  F (36.8 C) (Temporal)   Ht 5\' 8"  (1.727 m)   Wt 206 lb 6.4 oz (93.6 kg)   SpO2 93%   BMI 31.38 kg/m   Body mass index is 31.38 kg/m.  GENERAL: vitals reviewed and listed above, alert, oriented, appears well hydrated and in no acute distress HEENT: atraumatic, conjunctiva  clear, no obvious abnormalities on inspection of external nose and ears Right upper eye lid with  About 6 mm  Filiform verrucous  lesion with small base  No redness  OP :masked NECK: no obvious masses on inspection palpation  CV: HRRR, no clubbing cyanosis or  peripheral edema nl cap refill  MS: moves all extremities without noticeable focal  abnormality PSYCH: pleasant and cooperative, no obvious depression or anxiety  BP Readings from Last 3 Encounters:  07/04/19 116/72  02/01/18 108/70  02/19/17 108/62    ASSESSMENT AND PLAN:  Discussed the following assessment and plan:  Lesion of right upper eyelid - Plan: Ambulatory referral to Ophthalmology Seems  warty poss filiform wart recent growth   Advise removal and because of location  Eye doc or  Or other skin specialist  -Patient advised to return or notify health care team  if  new concerns arise.  Patient Instructions  This appears to be a warty lesion   And will send to  Referral ophthalmology and sometimes . You will be contacted  about this     Standley Brooking. Panosh M.D.

## 2019-07-04 ENCOUNTER — Other Ambulatory Visit: Payer: Self-pay

## 2019-07-04 ENCOUNTER — Ambulatory Visit (INDEPENDENT_AMBULATORY_CARE_PROVIDER_SITE_OTHER): Payer: 59 | Admitting: Internal Medicine

## 2019-07-04 ENCOUNTER — Encounter: Payer: Self-pay | Admitting: Internal Medicine

## 2019-07-04 VITALS — BP 116/72 | HR 83 | Temp 98.3°F | Ht 68.0 in | Wt 206.4 lb

## 2019-07-04 DIAGNOSIS — H029 Unspecified disorder of eyelid: Secondary | ICD-10-CM | POA: Diagnosis not present

## 2019-07-04 NOTE — Patient Instructions (Signed)
This appears to be a warty lesion   And will send to  Referral ophthalmology and sometimes . You will be contacted  about this

## 2019-10-24 ENCOUNTER — Ambulatory Visit: Payer: 59 | Admitting: Internal Medicine

## 2019-10-24 ENCOUNTER — Encounter: Payer: Self-pay | Admitting: Internal Medicine

## 2019-10-24 ENCOUNTER — Other Ambulatory Visit: Payer: Self-pay

## 2019-10-24 VITALS — BP 92/60 | HR 95 | Temp 99.3°F | Ht 68.0 in | Wt 199.6 lb

## 2019-10-24 DIAGNOSIS — F419 Anxiety disorder, unspecified: Secondary | ICD-10-CM | POA: Diagnosis not present

## 2019-10-24 DIAGNOSIS — M79622 Pain in left upper arm: Secondary | ICD-10-CM

## 2019-10-24 MED ORDER — CITALOPRAM HYDROBROMIDE 10 MG PO TABS: 10.0000 mg | ORAL_TABLET | Freq: Every day | ORAL | 2 refills | Status: AC

## 2019-10-24 NOTE — Progress Notes (Signed)
Chief Complaint  Patient presents with   Anxiety    increased anxiety, having panic attacks   Arm Pain    sharp pains in left arm shooting downward, started last Wednesday    HPI: Candice Wood 24 y.o. come in for concern about shooting pain in her left arm and increasing anxiety. All last week having  shootsing pain left arm . Comes from her shoulder down without associated weakness or numbness. She is left-handed no specific injury but did notice it minimum amount in the past Works at [employer] 10 hours a day physical lifting noted when at work in the next day seem to have been better after the weekend has used Advil.  Is having increase in her anxiety and evaluation for the left arm her mom sent to get checked out. Tends to be anxious with situational anxiety but will get some anxiety panic attack-like symptoms affecting her sleep and how she feels.  No syncope sob not assoc with anxieyt  See past notes  A year ago fam hx po anxiety not part panic  ROS: See pertinent positives and negatives per HPI. Neg td and rare etoh neg caffiene supplemtnes   Past Medical History:  Diagnosis Date   Acne    ACNE 11/02/2007   Qualifier: Diagnosis of  By: Fabian Sharp MD, Neta Mends    Eczema    neck   Headache(784.0) 01/09/2010   Qualifier: Diagnosis of  By: Fabian Sharp MD, Neta Mends    Migraine headache    eval peds neuro 2013 given zofran and  diary      Family History  Problem Relation Age of Onset   Allergies Mother    Migraines Father    Asthma Paternal Grandmother    Heart failure Other        pgm    Social History   Socioeconomic History   Marital status: Single    Spouse name: Not on file   Number of children: Not on file   Years of education: Not on file   Highest education level: Not on file  Occupational History   Not on file  Tobacco Use   Smoking status: Never Smoker   Smokeless tobacco: Never Used  Vaping Use   Vaping Use: Never used  Substance and  Sexual Activity   Alcohol use: Yes    Alcohol/week: 2.0 standard drinks    Types: 1 Shots of liquor, 1 Glasses of wine per week    Comment: Occassionally   Drug use: Never   Sexual activity: Yes    Birth control/protection: Condom  Other Topics Concern   Not on file  Social History Narrative   Single   western  Ok in school    [employer] then wants to transfer.    2 households    No pets   Playing basketball school   Social Determinants of Health   Financial Resource Strain:    Difficulty of Paying Living Expenses: Not on file  Food Insecurity:    Worried About Programme researcher, broadcasting/film/video in the Last Year: Not on file   [employer] of Food in the Last Year: Not on file  Transportation Needs:    Lack of Transportation (Medical): Not on file   Lack of Transportation (Non-Medical): Not on file  Physical Activity:    Days of Exercise per Week: Not on file   Minutes of Exercise per Session: Not on file  Stress:    Feeling of Stress : Not on  file  Social Connections:    Frequency of Communication with Friends and Family: Not on file   Frequency of Social Gatherings with Friends and Family: Not on file   Attends Religious Services: Not on file   Active Member of Clubs or Organizations: Not on file   Attends Banker Meetings: Not on file   Marital Status: Not on file    Outpatient Medications Prior to Visit  Medication Sig Dispense Refill   desonide (DESOWEN) 0.05 % cream Apply topically 2 (two) times daily. To rash 15 g 1   No facility-administered medications prior to visit.     EXAM:  BP 92/60    Pulse 95    Temp 99.3 F (37.4 C) (Oral)    Ht 5\' 8"  (1.727 m)    Wt 199 lb 9.6 oz (90.5 kg)    SpO2 99%    BMI 30.35 kg/m   Body mass index is 30.35 kg/m.  GENERAL: vitals reviewed and listed above, alert, oriented, appears well hydrated and in no acute distress HEENT: atraumatic, conjunctiva  clear, no obvious abnormalities on inspection of external nose  and ears OP masked  NECK: no obvious masses on inspection palpation  LUNGS: clear to auscultation bilaterally, no wheezes, rales or rhonchi, good air movement CV: HRRR, no clubbing cyanosis or  peripheral edema nl cap refill  MS: moves all extremities without noticeable focal  Abnormality  Area of discomfort is anterior  shoulder area but has good rom and not elicited pain   PSYCH: pleasant and cooperative,nl though processing and speech  Lab Results  Component Value Date   WBC 8.5 02/19/2017   HGB 12.5 02/19/2017   HCT 39.2 02/19/2017   PLT 239.0 02/19/2017   GLUCOSE 91 02/19/2017   CHOL 131 02/19/2017   TRIG 44.0 02/19/2017   HDL 50.80 02/19/2017   LDLCALC 72 02/19/2017   ALT 9 02/19/2017   AST 11 02/19/2017   NA 139 02/19/2017   K 3.9 02/19/2017   CL 105 02/19/2017   CREATININE 0.76 02/19/2017   BUN 11 02/19/2017   CO2 27 02/19/2017   TSH 2.47 02/19/2017   HGBA1C 5.7 03/03/2012   BP Readings from Last 3 Encounters:  10/24/19 92/60  07/04/19 116/72  02/01/18 108/70    ASSESSMENT AND PLAN:  Discussed the following assessment and plan:  Left upper arm pain - l hand predominant  physical  employment   Anxiety - w sounds like anxiety panic attacks \ Benefit from controller med and counseling to avoid further escalation   Anxiety plans and triggeres , begin low-dose citalopram 10 mg a day can try 5 mg if needed change CPX appointment to about 3 to 4 weeks and med check at that time expectant management. Advise regular counseling confidential for anxiety techniques. Suspect the left arm symptoms are mechanical in cause adaptation ice Advil if persistent progressive consider sports medicine eval. -Patient advised to return or notify health care team  if  new concerns arise.  Patient Instructions  I think your left arm pain seems to be a mecahnical poss overuse shoulder pain Ice cold after activity for 5- 10 minutes  advil aleve ok in short run  Look as lifting technique  and   If  persistent or progressive then  Let 02/03/18 know if not getting better.    Anxiety techniques counseling  And begin low dose medication suppression.   Please get a counselor to help with  Anxiety aggravation   Plan virtual  visit med check in about   3 weeks  Message [place] in interim if needed    Generalized Anxiety Disorder, Adult Generalized anxiety disorder (GAD) is a mental health disorder. People with this condition constantly worry about everyday events. Unlike normal anxiety, worry related to GAD is not triggered by a specific event. These worries also do not fade or get better with time. GAD interferes with life functions, including relationships, work, and school. GAD can vary from mild to severe. People with severe GAD can have intense waves of anxiety with physical symptoms (panic attacks). What are the causes? The exact cause of GAD is not known. What increases the risk? This condition is more likely to develop in:  Women.  People who have a family history of anxiety disorders.  People who are very shy.  People who experience very stressful life events, such as the death of a loved one.  People who have a very stressful family environment. What are the signs or symptoms? People with GAD often worry excessively about many things in their lives, such as their health and family. They may also be overly concerned about:  Doing well at work.  Being on time.  Natural disasters.  Friendships. Physical symptoms of GAD include:  Fatigue.  Muscle tension or having muscle twitches.  Trembling or feeling shaky.  Being easily startled.  Feeling like your heart is pounding or racing.  Feeling out of breath or like you cannot take a deep breath.  Having trouble falling asleep or staying asleep.  Sweating.  Nausea, diarrhea, or irritable bowel syndrome (IBS).  Headaches.  Trouble concentrating or remembering facts.  Restlessness.  Irritability. How is  this diagnosed? Your health care provider can diagnose GAD based on your symptoms and medical history. You will also have a physical exam. The health care provider will ask specific questions about your symptoms, including how severe they are, when they started, and if they come and go. Your health care provider may ask you about your use of alcohol or drugs, including prescription medicines. Your health care provider may refer you to a mental health specialist for further evaluation. Your health care provider will do a thorough examination and may perform additional tests to rule out other possible causes of your symptoms. To be diagnosed with GAD, a person must have anxiety that:  Is out of his or her control.  Affects several different aspects of his or her life, such as work and relationships.  Causes distress that makes him or her unable to take part in normal activities.  Includes at least three physical symptoms of GAD, such as restlessness, fatigue, trouble concentrating, irritability, muscle tension, or sleep problems. Before your health care provider can confirm a diagnosis of GAD, these symptoms must be present more days than they are not, and they must last for six months or longer. How is this treated? The following therapies are usually used to treat GAD:  Medicine. Antidepressant medicine is usually prescribed for long-term daily control. Antianxiety medicines may be added in severe cases, especially when panic attacks occur.  Talk therapy (psychotherapy). Certain types of talk therapy can be helpful in treating GAD by providing support, education, and guidance. Options include: ? Cognitive behavioral therapy (CBT). People learn coping skills and techniques to ease their anxiety. They learn to identify unrealistic or negative thoughts and behaviors and to replace them with positive ones. ? Acceptance and commitment therapy (ACT). This treatment teaches people how to be mindful as a  way to cope with unwanted thoughts and feelings. ? Biofeedback. This process trains you to manage your body's response (physiological response) through breathing techniques and relaxation methods. You will work with a therapist while machines are used to monitor your physical symptoms.  Stress management techniques. These include yoga, meditation, and exercise. A mental health specialist can help determine which treatment is best for you. Some people see improvement with one type of therapy. However, other people require a combination of therapies. Follow these instructions at home:  Take over-the-counter and prescription medicines only as told by your health care provider.  Try to maintain a normal routine.  Try to anticipate stressful situations and allow extra time to manage them.  Practice any stress management or self-calming techniques as taught by your health care provider.  Do not punish yourself for setbacks or for not making progress.  Try to recognize your accomplishments, even if they are small.  Keep all follow-up visits as told by your health care provider. This is important. Contact a health care provider if:  Your symptoms do not get better.  Your symptoms get worse.  You have signs of depression, such as: ? A persistently sad, cranky, or irritable mood. ? Loss of enjoyment in activities that used to bring you joy. ? Change in weight or eating. ? Changes in sleeping habits. ? Avoiding friends or family members. ? Loss of energy for normal tasks. ? Feelings of guilt or worthlessness. Get help right away if:  You have serious thoughts about hurting yourself or others. If you ever feel like you may hurt yourself or others, or have thoughts about taking your own life, get help right away. You can go to your nearest emergency department or call:  Your local emergency services (911 in the [place]  A suicide crisis helpline, such as the National Suicide Prevention  Lifeline at 929 573 26661-517-514-8405. This is open 24 hours a day. Summary  Generalized anxiety disorder (GAD) is a mental health disorder that involves worry that is not triggered by a specific event.  People with GAD often worry excessively about many things in their lives, such as their health and family.  GAD may cause physical symptoms such as restlessness, trouble concentrating, sleep problems, frequent sweating, nausea, diarrhea, headaches, and trembling or muscle twitching.  A mental health specialist can help determine which treatment is best for you. Some people see improvement with one type of therapy. However, other people require a combination of therapies. This information is not intended to replace advice given to you by your health care provider. Make sure you discuss any questions you have with your health care provider. Document Revised: 01/09/2017 Document Reviewed: 12/18/2015 Elsevier Patient Education  2020 [employer]     HuttoWanda K. Tylah Mancillas M.D.

## 2019-10-24 NOTE — Patient Instructions (Addendum)
I think your left arm pain seems to be a mecahnical poss overuse shoulder pain Ice cold after activity for 5- 10 minutes  advil aleve ok in short run  Look as lifting technique and   If  persistent or progressive then  Let us know if not getting better.    Anxiety techniques counseling  And begin low dose medication suppression.   Please get a counselor to help with  Anxiety aggravation   Plan virtual visit med check in about   3 weeks  Message [place] in interim if needed    Generalized Anxiety Disorder, Adult Generalized anxiety disorder (GAD) is a mental health disorder. People with this condition constantly worry about everyday events. Unlike normal anxiety, worry related to GAD is not triggered by a specific event. These worries also do not fade or get better with time. GAD interferes with life functions, including relationships, work, and school. GAD can vary from mild to severe. People with severe GAD can have intense waves of anxiety with physical symptoms (panic attacks). What are the causes? The exact cause of GAD is not known. What increases the risk? This condition is more likely to develop in:  Women.  People who have a family history of anxiety disorders.  People who are very shy.  People who experience very stressful life events, such as the death of a loved one.  People who have a very stressful family environment. What are the signs or symptoms? People with GAD often worry excessively about many things in their lives, such as their health and family. They may also be overly concerned about:  Doing well at work.  Being on time.  Natural disasters.  Friendships. Physical symptoms of GAD include:  Fatigue.  Muscle tension or having muscle twitches.  Trembling or feeling shaky.  Being easily startled.  Feeling like your heart is pounding or racing.  Feeling out of breath or like you cannot take a deep breath.  Having trouble falling asleep or staying  asleep.  Sweating.  Nausea, diarrhea, or irritable bowel syndrome (IBS).  Headaches.  Trouble concentrating or remembering facts.  Restlessness.  Irritability. How is this diagnosed? Your health care provider can diagnose GAD based on your symptoms and medical history. You will also have a physical exam. The health care provider will ask specific questions about your symptoms, including how severe they are, when they started, and if they come and go. Your health care provider may ask you about your use of alcohol or drugs, including prescription medicines. Your health care provider may refer you to a mental health specialist for further evaluation. Your health care provider will do a thorough examination and may perform additional tests to rule out other possible causes of your symptoms. To be diagnosed with GAD, a person must have anxiety that:  Is out of his or her control.  Affects several different aspects of his or her life, such as work and relationships.  Causes distress that makes him or her unable to take part in normal activities.  Includes at least three physical symptoms of GAD, such as restlessness, fatigue, trouble concentrating, irritability, muscle tension, or sleep problems. Before your health care provider can confirm a diagnosis of GAD, these symptoms must be present more days than they are not, and they must last for six months or longer. How is this treated? The following therapies are usually used to treat GAD:  Medicine. Antidepressant medicine is usually prescribed for long-term daily control. Antianxiety medicines may  be added in severe cases, especially when panic attacks occur.  Talk therapy (psychotherapy). Certain types of talk therapy can be helpful in treating GAD by providing support, education, and guidance. Options include: ? Cognitive behavioral therapy (CBT). People learn coping skills and techniques to ease their anxiety. They learn to identify  unrealistic or negative thoughts and behaviors and to replace them with positive ones. ? Acceptance and commitment therapy (ACT). This treatment teaches people how to be mindful as a way to cope with unwanted thoughts and feelings. ? Biofeedback. This process trains you to manage your body's response (physiological response) through breathing techniques and relaxation methods. You will work with a therapist while machines are used to monitor your physical symptoms.  Stress management techniques. These include yoga, meditation, and exercise. A mental health specialist can help determine which treatment is best for you. Some people see improvement with one type of therapy. However, other people require a combination of therapies. Follow these instructions at home:  Take over-the-counter and prescription medicines only as told by your health care provider.  Try to maintain a normal routine.  Try to anticipate stressful situations and allow extra time to manage them.  Practice any stress management or self-calming techniques as taught by your health care provider.  Do not punish yourself for setbacks or for not making progress.  Try to recognize your accomplishments, even if they are small.  Keep all follow-up visits as told by your health care provider. This is important. Contact a health care provider if:  Your symptoms do not get better.  Your symptoms get worse.  You have signs of depression, such as: ? A persistently sad, cranky, or irritable mood. ? Loss of enjoyment in activities that used to bring you joy. ? Change in weight or eating. ? Changes in sleeping habits. ? Avoiding friends or family members. ? Loss of energy for normal tasks. ? Feelings of guilt or worthlessness. Get help right away if:  You have serious thoughts about hurting yourself or others. If you ever feel like you may hurt yourself or others, or have thoughts about taking your own life, get help right  away. You can go to your nearest emergency department or call:  Your local emergency services (911 in the [place]  A suicide crisis helpline, such as the National Suicide Prevention Lifeline at (619)409-5377. This is open 24 hours a day. Summary  Generalized anxiety disorder (GAD) is a mental health disorder that involves worry that is not triggered by a specific event.  People with GAD often worry excessively about many things in their lives, such as their health and family.  GAD may cause physical symptoms such as restlessness, trouble concentrating, sleep problems, frequent sweating, nausea, diarrhea, headaches, and trembling or muscle twitching.  A mental health specialist can help determine which treatment is best for you. Some people see improvement with one type of therapy. However, other people require a combination of therapies. This information is not intended to replace advice given to you by your health care provider. Make sure you discuss any questions you have with your health care provider. Document Revised: 01/09/2017 Document Reviewed: 12/18/2015 Elsevier Patient Education  2020 [employer].

## 2019-10-31 ENCOUNTER — Ambulatory Visit: Payer: 59 | Admitting: Internal Medicine

## 2019-11-02 ENCOUNTER — Emergency Department (HOSPITAL_COMMUNITY)
Admission: EM | Admit: 2019-11-02 | Discharge: 2019-11-02 | Disposition: A | Discharge status: Home / Self Care | Payer: 59 | Attending: Emergency Medicine | Admitting: Emergency Medicine

## 2019-11-02 ENCOUNTER — Encounter (HOSPITAL_COMMUNITY): Payer: Self-pay | Admitting: Emergency Medicine

## 2019-11-02 ENCOUNTER — Other Ambulatory Visit: Payer: Self-pay

## 2019-11-02 ENCOUNTER — Emergency Department (HOSPITAL_COMMUNITY): Payer: 59

## 2019-11-02 DIAGNOSIS — Z7185 Encounter for immunization safety counseling: Secondary | ICD-10-CM

## 2019-11-02 DIAGNOSIS — Z7189 Other specified counseling: Secondary | ICD-10-CM | POA: Insufficient documentation

## 2019-11-02 DIAGNOSIS — N3001 Acute cystitis with hematuria: Secondary | ICD-10-CM

## 2019-11-02 DIAGNOSIS — R1031 Right lower quadrant pain: Secondary | ICD-10-CM | POA: Insufficient documentation

## 2019-11-02 LAB — I-STAT BETA HCG BLOOD, ED (MC, WL, AP ONLY): I-stat hCG, quantitative: <5 m[IU]/mL (ref <5)

## 2019-11-02 LAB — COMPREHENSIVE METABOLIC PANEL
ALT: 12 U/L (ref 0–44)
AST: 15 U/L (ref 15–41)
Albumin: 4.2 g/dL (ref 3.5–5.0)
Alkaline Phosphatase: 52 U/L (ref 38–126)
Anion gap: 10 (ref 5–15)
BUN: 8 mg/dL (ref 6–20)
CO2: 21 mmol/L — ABNORMAL LOW (ref 22–32)
Calcium: 9 mg/dL (ref 8.9–10.3)
Chloride: 106 mmol/L (ref 98–111)
Creatinine, Ser: 0.73 mg/dL (ref 0.44–1.00)
GFR calc Af Amer: >60 mL/min (ref >60)
GFR calc non Af Amer: >60 mL/min (ref >60)
Glucose, Bld: 113 mg/dL — ABNORMAL HIGH (ref 70–99)
Potassium: 3.6 mmol/L (ref 3.5–5.1)
Sodium: 137 mmol/L (ref 135–145)
Total Bilirubin: 0.6 mg/dL (ref 0.3–1.2)
Total Protein: 7.6 g/dL (ref 6.5–8.1)

## 2019-11-02 LAB — URINALYSIS, ROUTINE W REFLEX MICROSCOPIC
Bilirubin Urine: NEGATIVE
Glucose, UA: NEGATIVE mg/dL
Ketones, ur: 20 mg/dL — AB
Nitrite: NEGATIVE
Protein, ur: NEGATIVE mg/dL
Specific Gravity, Urine: 1.018 (ref 1.005–1.030)
WBC, UA: >50 WBC/hpf — ABNORMAL HIGH (ref 0–5)
pH: 8 (ref 5.0–8.0)

## 2019-11-02 LAB — CBC
HCT: 40.7 % (ref 36.0–46.0)
Hemoglobin: 13.3 g/dL (ref 12.0–15.0)
MCH: 27.7 pg (ref 26.0–34.0)
MCHC: 32.7 g/dL (ref 30.0–36.0)
MCV: 84.6 fL (ref 80.0–100.0)
Platelets: 244 10*3/uL (ref 150–400)
RBC: 4.81 MIL/uL (ref 3.87–5.11)
RDW: 13.8 % (ref 11.5–15.5)
WBC: 13.4 10*3/uL — ABNORMAL HIGH (ref 4.0–10.5)
nRBC: 0 % (ref 0.0–0.2)

## 2019-11-02 LAB — WET PREP, GENITAL
Clue Cells Wet Prep HPF POC: NONE SEEN
Sperm: NONE SEEN
Trich, Wet Prep: NONE SEEN
Yeast Wet Prep HPF POC: NONE SEEN

## 2019-11-02 LAB — LIPASE, BLOOD: Lipase: 25 U/L (ref 11–51)

## 2019-11-02 MED ORDER — PHENAZOPYRIDINE HCL 200 MG PO TABS: 200.0000 mg | ORAL_TABLET | Freq: Three times a day (TID) | ORAL | Status: DC

## 2019-11-02 MED ORDER — IOHEXOL 300 MG/ML  SOLN
100.0000 mL | Freq: Once | INTRAMUSCULAR | Status: AC | PRN
  Administered 2019-11-02: 100 mL via INTRAVENOUS

## 2019-11-02 MED ORDER — PHENAZOPYRIDINE HCL 200 MG PO TABS: 200.0000 mg | ORAL_TABLET | Freq: Three times a day (TID) | ORAL | 0 refills | Status: DC

## 2019-11-02 MED ORDER — MORPHINE SULFATE (PF) 4 MG/ML IV SOLN
4.0000 mg | Freq: Once | INTRAVENOUS | Status: AC
  Administered 2019-11-02: 4 mg via INTRAVENOUS
  Filled 2019-11-02: qty 1

## 2019-11-02 MED ORDER — SULFAMETHOXAZOLE-TRIMETHOPRIM 800-160 MG PO TABS: 1.0000 | ORAL_TABLET | Freq: Two times a day (BID) | ORAL | 0 refills | Status: AC

## 2019-11-02 MED ORDER — ONDANSETRON HCL 4 MG/2ML IJ SOLN
4.0000 mg | Freq: Once | INTRAMUSCULAR | Status: AC
  Administered 2019-11-02: 4 mg via INTRAVENOUS
  Filled 2019-11-02: qty 2

## 2019-11-02 MED ORDER — ONDANSETRON 4 MG PO TBDP: 4.0000 mg | ORAL_TABLET | Freq: Three times a day (TID) | ORAL | 0 refills | Status: DC | PRN

## 2019-11-02 MED ORDER — SODIUM CHLORIDE 0.9 % IV BOLUS
1000.0000 mL | Freq: Once | INTRAVENOUS | Status: AC
  Administered 2019-11-02: 1000 mL via INTRAVENOUS

## 2019-11-02 MED ORDER — IBUPROFEN 800 MG PO TABS: 800.0000 mg | ORAL_TABLET | Freq: Three times a day (TID) | ORAL | 0 refills | Status: AC

## 2019-11-02 MED ORDER — SULFAMETHOXAZOLE-TRIMETHOPRIM 800-160 MG PO TABS
1.0000 | ORAL_TABLET | Freq: Once | ORAL | Status: AC
  Administered 2019-11-02: 1 via ORAL
  Filled 2019-11-02: qty 1

## 2019-11-02 MED ORDER — PHENAZOPYRIDINE HCL 200 MG PO TABS
200.0000 mg | ORAL_TABLET | Freq: Once | ORAL | Status: AC
  Administered 2019-11-02: 200 mg via ORAL
  Filled 2019-11-02: qty 1

## 2019-11-02 NOTE — Discharge Instructions (Signed)
Please consider getting your Covid vaccine.    [email or web address]  Return for worsening of symptoms.

## 2019-11-02 NOTE — ED Triage Notes (Signed)
Per pt, states right lower quadrant pain since early this am-no other symptoms

## 2019-11-02 NOTE — ED Provider Notes (Signed)
[facility]-EMERGENCY DEPT Provider Note   CSN: [id number] Arrival date & time: 11/02/19  1507     History Chief Complaint  Patient presents with  . Abdominal Pain    Candice Wood is a 24 y.o. female.  Pt presents to the ED today with abdominal pain.  She said it started this morning.  Pt has not had any vomiting.  Pt denies any f/c.  She finished her period a few days ago. She is not covid vaccinated.        Past Medical History:  Diagnosis Date  . Acne   . ACNE 11/02/2007   Qualifier: Diagnosis of  By: Fabian Sharp MD, Neta Mends   . Eczema    neck  . Headache(784.0) 01/09/2010   Qualifier: Diagnosis of  By: Fabian Sharp MD, Neta Mends   . Migraine headache    eval peds neuro 2013 given zofran and  diary      Patient Active Problem List   Diagnosis Date Noted  . Migraine headache   . Dysmenorrhea 09/29/2014  . Rapid weight loss 02/27/2012    Past Surgical History:  Procedure Laterality Date  . NO PAST SURGERIES       OB History   No obstetric history on file.     Family History  Problem Relation Age of Onset  . Allergies Mother   . Migraines Father   . Asthma Paternal Grandmother   . Heart failure Other        pgm    Social History   Tobacco Use  . Smoking status: Never Smoker  . Smokeless tobacco: Never Used  Vaping Use  . Vaping Use: Never used  Substance Use Topics  . Alcohol use: Yes    Alcohol/week: 2.0 standard drinks    Types: 1 Shots of liquor, 1 Glasses of wine per week    Comment: Occassionally  . Drug use: Never    Home Medications Prior to Admission medications   Medication Sig Start Date End Date Taking? Authorizing Provider  acetaminophen (TYLENOL) 325 MG tablet Take 650 mg by mouth every 6 (six) hours as needed for headache.   Yes [provider]  citalopram (CELEXA) 10 MG tablet Take 1 tablet (10 mg total) by mouth daily. 10/24/19  Yes Panosh, Neta Mends, MD  desonide (DESOWEN) 0.05 % cream Apply topically  2 (two) times daily. To rash Patient taking differently: Apply 1 application topically daily as needed (for rash). To rash 02/01/18  Yes Panosh, Neta Mends, MD  ibuprofen (ADVIL) 800 MG tablet Take 1 tablet (800 mg total) by mouth 3 (three) times daily. 11/02/19   Jacalyn Lefevre, MD  ondansetron (ZOFRAN ODT) 4 MG disintegrating tablet Take 1 tablet (4 mg total) by mouth every 8 (eight) hours as needed for nausea or vomiting. 11/02/19   Jacalyn Lefevre, MD  phenazopyridine (PYRIDIUM) 200 MG tablet Take 1 tablet (200 mg total) by mouth 3 (three) times daily. 11/02/19   Jacalyn Lefevre, MD  sulfamethoxazole-trimethoprim (BACTRIM DS) 800-160 MG tablet Take 1 tablet by mouth 2 (two) times daily for 7 days. 11/02/19 11/09/19  Jacalyn Lefevre, MD    Allergies    Patient has no known allergies.  Review of Systems   Review of Systems  Gastrointestinal: Positive for abdominal pain.  All other systems reviewed and are negative.   Physical Exam Updated Vital Signs BP 114/74   Pulse 69   Temp 97.8 F (36.6 C) (Oral)   Resp 18   Ht  5\' 8"  (1.727 m)   Wt 90.7 kg   SpO2 100%   BMI 30.41 kg/m   Physical Exam Vitals and nursing note reviewed.  Constitutional:      Appearance: She is well-developed.  HENT:     Head: Normocephalic and atraumatic.     Mouth/Throat:     Mouth: Mucous membranes are moist.     Pharynx: Oropharynx is clear.  Eyes:     Extraocular Movements: Extraocular movements intact.     Pupils: Pupils are equal, round, and reactive to light.  Cardiovascular:     Rate and Rhythm: Normal rate and regular rhythm.     Heart sounds: Normal heart sounds.  Pulmonary:     Effort: Pulmonary effort is normal.     Breath sounds: Normal breath sounds.  Abdominal:     General: Abdomen is flat. Bowel sounds are normal.     Palpations: Abdomen is soft.     Tenderness: There is abdominal tenderness in the right lower quadrant.  Skin:    General: Skin is warm.     Capillary Refill: Capillary  refill takes less than 2 seconds.  Neurological:     General: No focal deficit present.     Mental Status: She is alert and oriented to person, place, and time.  Psychiatric:        Mood and Affect: Mood normal.        Behavior: Behavior normal.     ED Results / Procedures / Treatments   Labs (all labs ordered are listed, but only abnormal results are displayed) Labs Reviewed  WET PREP, GENITAL - Abnormal; Notable for the following components:      Result Value   WBC, Wet Prep HPF POC FEW (*)    All other components within normal limits  COMPREHENSIVE METABOLIC PANEL - Abnormal; Notable for the following components:   CO2 21 (*)    Glucose, Bld 113 (*)    All other components within normal limits  CBC - Abnormal; Notable for the following components:   WBC 13.4 (*)    All other components within normal limits  URINALYSIS, ROUTINE W REFLEX MICROSCOPIC - Abnormal; Notable for the following components:   APPearance HAZY (*)    Hgb urine dipstick SMALL (*)    Ketones, ur 20 (*)    Leukocytes,Ua LARGE (*)    WBC, UA >50 (*)    Bacteria, UA FEW (*)    All other components within normal limits  LIPASE, BLOOD  I-STAT BETA HCG BLOOD, ED ([facility], [facility], [facility] ONLY)  GC/CHLAMYDIA PROBE AMP ([place]) NOT AT [facility]    EKG None  Radiology CT ABDOMEN PELVIS W CONTRAST  Result Date: 11/02/2019 CLINICAL DATA:  Right lower quadrant abdominal pain EXAM: CT ABDOMEN AND PELVIS WITH CONTRAST TECHNIQUE: Multidetector CT imaging of the abdomen and pelvis was performed using the standard protocol following bolus administration of intravenous contrast. CONTRAST:  11/04/2019 OMNIPAQUE IOHEXOL 300 MG/ML  SOLN COMPARISON:  None. FINDINGS: Lower chest: No acute pleural or parenchymal lung disease. Hepatobiliary: No focal liver abnormality is seen. No gallstones, gallbladder wall thickening, or biliary dilatation. Pancreas: Unremarkable. No pancreatic ductal dilatation or surrounding inflammatory changes. Spleen:  Normal in size without focal abnormality. Adrenals/Urinary Tract: Adrenal glands are unremarkable. Kidneys are normal, without renal calculi, focal lesion, or hydronephrosis. Bladder is unremarkable. Stomach/Bowel: No bowel obstruction or ileus. Normal gas-filled appendix right lower quadrant. No bowel wall thickening or inflammatory change. Vascular/Lymphatic: No significant vascular findings are present.  No enlarged abdominal or pelvic lymph nodes. Reproductive: Uterus and bilateral adnexa are unremarkable. Other: Trace pelvic free fluid is likely physiologic. No free intra-abdominal gas. No abdominal wall hernia. Musculoskeletal: No acute or destructive bony lesions. Reconstructed images demonstrate no additional findings. IMPRESSION: 1. Normal appendix. 2. Trace pelvic free fluid, likely physiologic. 3. No acute intra-abdominal or intrapelvic process. Electronically Signed   By: Sharlet Salina M.D.   On: 11/02/2019 18:20    Procedures Procedures (including critical care time)  Medications Ordered in ED Medications  sulfamethoxazole-trimethoprim (BACTRIM DS) 800-160 MG per tablet 1 tablet (has no administration in time range)  phenazopyridine (PYRIDIUM) tablet 200 mg (has no administration in time range)  sodium chloride 0.9 % bolus 1,000 mL (1,000 mLs Intravenous New Bag/Given 11/02/19 1649)  morphine 4 MG/ML injection 4 mg (4 mg Intravenous Given 11/02/19 1648)  ondansetron (ZOFRAN) injection 4 mg (4 mg Intravenous Given 11/02/19 1647)  iohexol (OMNIPAQUE) 300 MG/ML solution 100 mL (100 mLs Intravenous Contrast Given 11/02/19 1752)    ED Course  I have reviewed the triage vital signs and the nursing notes.  Pertinent labs & imaging results that were available during my care of the patient were reviewed by me and considered in my medical decision making (see chart for details).    MDM Rules/Calculators/A&P                          Pt is feeling better after treatment.  No appy.  She does  have a UTI which will be treated with bactrim and pyridium.  Pt is counseled on getting the Covid vaccine.  She is given the [facility] website for more information. (http://rich.org/)   She will think about it.  She is told to return if worse.  F/u with pcp.  Final Clinical Impression(s) / ED Diagnoses Final diagnoses:  Right lower quadrant abdominal pain  Acute cystitis with hematuria  Vaccine counseling    Rx / DC Orders ED Discharge Orders         Ordered    sulfamethoxazole-trimethoprim (BACTRIM DS) 800-160 MG tablet  2 times daily        11/02/19 1830    phenazopyridine (PYRIDIUM) 200 MG tablet  3 times daily        11/02/19 1830    ondansetron (ZOFRAN ODT) 4 MG disintegrating tablet  Every 8 hours PRN        11/02/19 1830    ibuprofen (ADVIL) 800 MG tablet  3 times daily        11/02/19 1830           Jacalyn Lefevre, MD 11/02/19 [phone]

## 2019-11-03 LAB — GC/CHLAMYDIA PROBE AMP (~~LOC~~) NOT AT ARMC
Chlamydia: POSITIVE — AB
Comment: NEGATIVE
Comment: NORMAL
Neisseria Gonorrhea: POSITIVE — AB

## 2019-11-13 NOTE — Progress Notes (Signed)
Chief Complaint  Patient presents with   Annual Exam    HPI: Patient  Candice SimasJessica A Wood  24 y.o. comes in today for Preventive Health Care visit  And follow up  Citalopram    Helps  With  Augmentation of  Anxiety.   Takes in am .  Less panic attack   Since l;ast visit  rlq pain in ed   Elevated wbc and pyuria  Dx uti rx septra   And wet prep  abd ct r/o  appendicitiis  positive gc chlamydia and was infomred see below  [facility]   [facility]ot and  And doxycycline.  7 days.   Doing much better   No sx left   Still dec appetite no pain   Vomiting  Bowel changes  Partner  To be or is treated .  LMP  About 2-3 weeks ago ( preg neg in ed)  Concern about se of ocps mood and remembering to take.   Health Maintenance  Topic Date Due   Hepatitis C Screening  Never done   PAP-Cervical Cytology Screening  Never done   PAP SMEAR-Modifier  Never done   COVID-19 Vaccine (1) 11/30/2019 (Originally 11/17/2008)   INFLUENZA VACCINE  05/10/2020 (Originally 09/11/2019)   TETANUS/TDAP  11/13/2029   HIV Screening  Completed   Health Maintenance Review LIFESTYLE:  Exercise:  Moving  Tobacco/ETS: no Alcohol:  ocass Sugar beverages:  Sweet tea  .  Most;y water  Sleep: 6+ Drug use: no HH of   2  Work:  40 hours  Week.     ROS:  GEN/ HEENT: No fever, significant weight changes sweats headaches vision problems hearing changes, CV/ PULM; No chest pain shortness of breath cough, syncope,edema  change in exercise tolerance. GI /GU: No adominal pain, vomiting, change in bowel habits. No blood in the stool. No significant GU symptoms. SKIN/HEME: ,no acute skin rashes suspicious lesions or bleeding. No lymphadenopathy, nodules, masses.  NEURO/ PSYCH:  No neurologic signs such as weakness numbness. No depression anxiety. IMM/ Allergy: No unusual infections.  Allergy .   REST of 12 system review negative except as per HPI   Past Medical History:  Diagnosis Date   Acne    ACNE  11/02/2007   Qualifier: Diagnosis of  By: Fabian SharpPanosh MD, Neta MendsWanda K    Eczema    neck   Headache(784.0) 01/09/2010   Qualifier: Diagnosis of  By: Fabian SharpPanosh MD, Neta MendsWanda K    Migraine headache    eval peds neuro 2013 given zofran and  diary      Past Surgical History:  Procedure Laterality Date   NO PAST SURGERIES      Family History  Problem Relation Age of Onset   Allergies Mother    Migraines Father    Asthma Paternal Grandmother    Heart failure Other        pgm    Social History   Socioeconomic History   Marital status: Single    Spouse name: Not on file   Number of children: Not on file   Years of education: Not on file   Highest education level: Not on file  Occupational History   Not on file  Tobacco Use   Smoking status: Never Smoker   Smokeless tobacco: Never Used  Vaping Use   Vaping Use: Never used  Substance and Sexual Activity   Alcohol use: Yes    Alcohol/week: 2.0 standard drinks    Types: 1 Glasses of  wine, 1 Shots of liquor per week    Comment: Occassionally   Drug use: Never   Sexual activity: Yes    Birth control/protection: Condom  Other Topics Concern   Not on file  Social History Narrative   Single   western  Ok in school    [employer] then wants to transfer.    2 households    No pets   Playing basketball school   Social Determinants of Health   Financial Resource Strain:    Difficulty of Paying Living Expenses: Not on file  Food Insecurity:    Worried About Programme researcher, broadcasting/film/video in the Last Year: Not on file   [employer] of Food in the Last Year: Not on file  Transportation Needs:    Lack of Transportation (Medical): Not on file   Lack of Transportation (Non-Medical): Not on file  Physical Activity:    Days of Exercise per Week: Not on file   Minutes of Exercise per Session: Not on file  Stress:    Feeling of Stress : Not on file  Social Connections:    Frequency of Communication with Friends and Family: Not on file     Frequency of Social Gatherings with Friends and Family: Not on file   Attends Religious Services: Not on file   Active Member of Clubs or Organizations: Not on file   Attends Banker Meetings: Not on file   Marital Status: Not on file    Outpatient Medications Prior to Visit  Medication Sig Dispense Refill   acetaminophen (TYLENOL) 325 MG tablet Take 650 mg by mouth every 6 (six) hours as needed for headache.     citalopram (CELEXA) 10 MG tablet Take 1 tablet (10 mg total) by mouth daily. 30 tablet 2   desonide (DESOWEN) 0.05 % cream Apply topically 2 (two) times daily. To rash (Patient taking differently: Apply 1 application topically daily as needed (for rash). To rash) 15 g 1   ibuprofen (ADVIL) 800 MG tablet Take 1 tablet (800 mg total) by mouth 3 (three) times daily. 21 tablet 0   ondansetron (ZOFRAN ODT) 4 MG disintegrating tablet Take 1 tablet (4 mg total) by mouth every 8 (eight) hours as needed for nausea or vomiting. 20 tablet 0   phenazopyridine (PYRIDIUM) 200 MG tablet Take 1 tablet (200 mg total) by mouth 3 (three) times daily. 6 tablet 0   No facility-administered medications prior to visit.     EXAM:  BP 116/80    Pulse 78    Ht 5\' 8"  (1.727 m)    Wt 195 lb (88.5 kg)    LMP 10/24/2019    SpO2 100%    BMI 29.65 kg/m   Body mass index is 29.65 kg/m. Wt Readings from Last 3 Encounters:  11/14/19 195 lb (88.5 kg)  11/02/19 200 lb (90.7 kg)  10/24/19 199 lb 9.6 oz (90.5 kg)    Physical Exam: Vital signs reviewed 10/26/19 is a well-developed well-nourished alert cooperative    who appearsr stated age in no acute distress.  HEENT: normocephalic atraumatic , Eyes: PERRL EOM's full, conjunctiva clear, Nares: paten,t no deformity discharge or tenderness., Ears: no deformity EAC's clear TMs with normal landmarks. Mouth: masked NECK: supple without masses, thyromegaly or bruits. CHEST/PULM:  Clear to auscultation and percussion breath sounds equal no  wheeze , rales or rhonchi. No chest wall deformities or tenderness. Breast: normal by inspection . No dimpling, discharge, masses, tenderness or discharge . CV: PMI  is nondisplaced, S1 S2 no gallops, murmurs, rubs. Peripheral pulses are full without delay.No JVD .  ABDOMEN: Bowel sounds normal nontender  No guard or rebound, no hepato splenomegal no CVA tenderness.  No hernia. Extremtities:  No clubbing cyanosis or edema, no acute joint swelling or redness no focal atrophy NEURO:  Oriented x3, cranial nerves 3-12 appear to be intact, no obvious focal weakness,gait within normal limits no abnormal reflexes or asymmetrical SKIN: No acute rashes normal turgor, color, no bruising or petechiae. PSYCH: Oriented, good eye contact, no obvious depression anxiety, cognition and judgment appear normal. LN: no cervical axillary inguinal adenopathy Pelvic: NL ext GU, labia clear without lesions or rash . Vagina no lesions .Cervix: clear  UTERUS: Neg CMT Adnexa:  clear no masses . PAP done   Lab Results  Component Value Date   WBC 13.4 (H) 11/02/2019   HGB 13.3 11/02/2019   HCT 40.7 11/02/2019   PLT 244 11/02/2019   GLUCOSE 113 (H) 11/02/2019   CHOL 131 02/19/2017   TRIG 44.0 02/19/2017   HDL 50.80 02/19/2017   LDLCALC 72 02/19/2017   ALT 12 11/02/2019   AST 15 11/02/2019   NA 137 11/02/2019   K 3.6 11/02/2019   CL 106 11/02/2019   CREATININE 0.73 11/02/2019   BUN 8 11/02/2019   CO2 21 (L) 11/02/2019   TSH 2.47 02/19/2017   HGBA1C 5.7 03/03/2012    BP Readings from Last 3 Encounters:  11/14/19 116/80  11/02/19 114/74  10/24/19 92/60    Lab plan and prev results  reviewed with patient  Had a muffin today   ASSESSMENT AND PLAN:  Discussed the following assessment and plan:    ICD-10-CM   1. Visit for preventive health examination  Z00.00 TSH    Lipid panel    Hemoglobin A1c    BASIC METABOLIC PANEL WITH GFR    T4, free    Hepatic function panel    RPR    HIV Antibody (routine  testing w rflx)    Hepatitis C antibody    PAP [[place]]    PAP [[place]]    Hepatitis C antibody    HIV Antibody (routine testing w rflx)    RPR    Hepatic function panel    T4, free    BASIC METABOLIC PANEL WITH GFR    Hemoglobin A1c    Lipid panel    TSH  2. Routine screening for STI (sexually transmitted infection)  Z11.3 TSH    Lipid panel    Hemoglobin A1c    BASIC METABOLIC PANEL WITH GFR    T4, free    Hepatic function panel    RPR    HIV Antibody (routine testing w rflx)    Hepatitis C antibody    PAP [[place]]    PAP [[place]]    Hepatitis C antibody    HIV Antibody (routine testing w rflx)    RPR    Hepatic function panel    T4, free    BASIC METABOLIC PANEL WITH GFR    Hemoglobin A1c    Lipid panel    TSH  3. Medication management  Z79.899 TSH    Lipid panel    Hemoglobin A1c    BASIC METABOLIC PANEL WITH GFR    T4, free    Hepatic function panel    RPR    HIV Antibody (routine testing w rflx)    Hepatitis C antibody    PAP [[place]]  PAP [[place]]    Hepatitis C antibody    HIV Antibody (routine testing w rflx)    RPR    Hepatic function panel    T4, free    BASIC METABOLIC PANEL WITH GFR    Hemoglobin A1c    Lipid panel    TSH  4. Need for hepatitis C screening test  Z11.59 TSH    Lipid panel    Hemoglobin A1c    BASIC METABOLIC PANEL WITH GFR    T4, free    Hepatic function panel    RPR    HIV Antibody (routine testing w rflx)    Hepatitis C antibody    PAP [[place]]    PAP [[place]]    Hepatitis C antibody    HIV Antibody (routine testing w rflx)    RPR    Hepatic function panel    T4, free    BASIC METABOLIC PANEL WITH GFR    Hemoglobin A1c    Lipid panel    TSH  5. Pap smear for cervical cancer screening  Z12.4   6. Decreased appetite  R63.0 TSH    Lipid panel    Hemoglobin A1c    BASIC METABOLIC PANEL WITH GFR    T4, free    Hepatic function panel    RPR    HIV Antibody (routine  testing w rflx)    Hepatitis C antibody    PAP [[place]]    PAP [[place]]    Hepatitis C antibody    HIV Antibody (routine testing w rflx)    RPR    Hepatic function panel    T4, free    BASIC METABOLIC PANEL WITH GFR    Hemoglobin A1c    Lipid panel    TSH  7. Need for vaccination  Z23 Tdap vaccine greater than or equal to 7yo IM    PAP []    PAP []  8. Encounter for gynecological examination without abnormal finding  Z01.419   9. Pelvic inflammatory disease (PID)probable  N73.9    under treatment   symptoms resolved  10. Encounter for other general counseling or advice on contraception  Z30.09      Ed visit  Review  See above appears that all sx were from sti and prob early PID  Is better . Had a ct scan She was rx for uti b. Did finish the  Med before the doxy  Lab today  Lipid  bg  Cbc crp and hiv rpr Due tdap  Decline flu at this time will think about covid vaccine Will follow up after  All finished  About a month and reassess the appetite   Sx .  ROV virtual ok in about a Month  Return in about 1 month (around 12/15/2019) for med check fu .  Patient Care Team: Madelin Headings, MD as PCP - General Patient Instructions  Will notify you  of labs when available.  Your exam is good today .  Continue citalopram   daily .  considier  iud or implant for long term contraception that shouldn't effect your mood.  [facility]   Condoms 100 % . To prevent sti etc . NO SA until all treated and better   . Your pain was probabaly due to  PID infection  Mild  From chlamydia and GC     Health Maintenance, Female Adopting a healthy lifestyle  and getting preventive care are important in promoting health and wellness. Ask your health care provider about:  The right schedule for you to have regular tests and exams.  Things you can do on your own to prevent diseases and keep yourself healthy. What should I know about  diet, weight, and exercise? Eat a healthy diet   Eat a diet that includes plenty of vegetables, fruits, low-fat dairy products, and lean protein.  Do not eat a lot of foods that are high in solid fats, added sugars, or sodium. Maintain a healthy weight Body mass index (BMI) is used to identify weight problems. It estimates body fat based on height and weight. Your health care provider can help determine your BMI and help you achieve or maintain a healthy weight. Get regular exercise Get regular exercise. This is one of the most important things you can do for your health. Most adults should:  Exercise for at least 150 minutes each week. The exercise should increase your heart rate and make you sweat (moderate-intensity exercise).  Do strengthening exercises at least twice a week. This is in addition to the moderate-intensity exercise.  Spend less time sitting. Even light physical activity can be beneficial. Watch cholesterol and blood lipids Have your blood tested for lipids and cholesterol at 23 years of age, then have this test every 5 years. Have your cholesterol levels checked more often if:  Your lipid or cholesterol levels are high.  You are older than 23 years of age.  You are at high risk for heart disease. What should I know about cancer screening? Depending on your health history and family history, you may need to have cancer screening at various ages. This may include screening for:  Breast cancer.  Cervical cancer.  Colorectal cancer.  Skin cancer.  Lung cancer. What should I know about heart disease, diabetes, and high blood pressure? Blood pressure and heart disease  High blood pressure causes heart disease and increases the risk of stroke. This is more likely to develop in people who have high blood pressure readings, are of African descent, or are overweight.  Have your blood pressure checked: ? Every 3-5 years if you are 48-62 years of age. ? Every year  if you are 21 years old or older. Diabetes Have regular diabetes screenings. This checks your fasting blood sugar level. Have the screening done:  Once every three years after age 63 if you are at a normal weight and have a low risk for diabetes.  More often and at a younger age if you are overweight or have a high risk for diabetes. What should I know about preventing infection? Hepatitis B If you have a higher risk for hepatitis B, you should be screened for this virus. Talk with your health care provider to find out if you are at risk for hepatitis B infection. Hepatitis C Testing is recommended for:  Everyone born from [birth year] through 1964.  Anyone with known risk factors for hepatitis C. Sexually transmitted infections (STIs)  Get screened for STIs, including gonorrhea and chlamydia, if: ? You are sexually active and are younger than 23 years of age. ? You are older than 23 years of age and your health care provider tells you that you are at risk for this type of infection. ? Your sexual activity has changed since you were last screened, and you are at increased risk for chlamydia or gonorrhea. Ask your health care provider if you are at risk.  Ask  your health care provider about whether you are at high risk for HIV. Your health care provider may recommend a prescription medicine to help prevent HIV infection. If you choose to take medicine to prevent HIV, you should first get tested for HIV. You should then be tested every 3 months for as long as you are taking the medicine. Pregnancy  If you are about to stop having your period (premenopausal) and you may become pregnant, seek counseling before you get pregnant.  Take 400 to 800 micrograms (mcg) of folic acid every day if you become pregnant.  Ask for birth control (contraception) if you want to prevent pregnancy. Osteoporosis and menopause Osteoporosis is a disease in which the bones lose minerals and strength with aging. This  can result in bone fractures. If you are 87 years old or older, or if you are at risk for osteoporosis and fractures, ask your health care provider if you should:  Be screened for bone loss.  Take a calcium or vitamin D supplement to lower your risk of fractures.  Be given hormone replacement therapy (HRT) to treat symptoms of menopause. Follow these instructions at home: Lifestyle  Do not use any products that contain nicotine or tobacco, such as cigarettes, e-cigarettes, and chewing tobacco. If you need help quitting, ask your health care provider.  Do not use street drugs.  Do not share needles.  Ask your health care provider for help if you need support or information about quitting drugs. Alcohol use  Do not drink alcohol if: ? Your health care provider tells you not to drink. ? You are pregnant, may be pregnant, or are planning to become pregnant.  If you drink alcohol: ? Limit how much you use to 0-1 drink a day. ? Limit intake if you are breastfeeding.  Be aware of how much alcohol is in your drink. In the [place] one drink equals one 12 oz bottle of beer (355 mL), one 5 oz glass of wine (148 mL), or one 1 oz glass of hard liquor (44 mL). General instructions  Schedule regular health, dental, and eye exams.  Stay current with your vaccines.  Tell your health care provider if: ? You often feel depressed. ? You have ever been abused or do not feel safe at home. Summary  Adopting a healthy lifestyle and getting preventive care are important in promoting health and wellness.  Follow your health care provider's instructions about healthy diet, exercising, and getting tested or screened for diseases.  Follow your health care provider's instructions on monitoring your cholesterol and blood pressure. This information is not intended to replace advice given to you by your health care provider. Make sure you discuss any questions you have with your health care  provider. Document Revised: 01/20/2018 Document Reviewed: 01/20/2018 Elsevier Patient Education  2020 [employer].      Solomon K. Trana Ressler M.D.

## 2019-11-14 ENCOUNTER — Ambulatory Visit (INDEPENDENT_AMBULATORY_CARE_PROVIDER_SITE_OTHER): Payer: 59 | Admitting: Internal Medicine

## 2019-11-14 ENCOUNTER — Encounter: Payer: Self-pay | Admitting: Internal Medicine

## 2019-11-14 ENCOUNTER — Other Ambulatory Visit (HOSPITAL_COMMUNITY)
Admission: RE | Admit: 2019-11-14 | Discharge: 2019-11-14 | Disposition: A | Discharge status: Home / Self Care | Payer: 59 | Source: Ambulatory Visit | Attending: Internal Medicine | Admitting: Internal Medicine

## 2019-11-14 ENCOUNTER — Other Ambulatory Visit: Payer: Self-pay

## 2019-11-14 VITALS — BP 116/80 | HR 78 | Ht 68.0 in | Wt 195.0 lb

## 2019-11-14 DIAGNOSIS — Z79899 Other long term (current) drug therapy: Secondary | ICD-10-CM | POA: Insufficient documentation

## 2019-11-14 DIAGNOSIS — Z0000 Encounter for general adult medical examination without abnormal findings: Secondary | ICD-10-CM | POA: Diagnosis present

## 2019-11-14 DIAGNOSIS — Z113 Encounter for screening for infections with a predominantly sexual mode of transmission: Secondary | ICD-10-CM | POA: Diagnosis present

## 2019-11-14 DIAGNOSIS — Z124 Encounter for screening for malignant neoplasm of cervix: Secondary | ICD-10-CM | POA: Diagnosis not present

## 2019-11-14 DIAGNOSIS — N739 Female pelvic inflammatory disease, unspecified: Secondary | ICD-10-CM

## 2019-11-14 DIAGNOSIS — R630 Anorexia: Secondary | ICD-10-CM | POA: Diagnosis present

## 2019-11-14 DIAGNOSIS — Z1159 Encounter for screening for other viral diseases: Secondary | ICD-10-CM | POA: Insufficient documentation

## 2019-11-14 DIAGNOSIS — Z23 Encounter for immunization: Secondary | ICD-10-CM

## 2019-11-14 DIAGNOSIS — Z3009 Encounter for other general counseling and advice on contraception: Secondary | ICD-10-CM

## 2019-11-14 DIAGNOSIS — Z01419 Encounter for gynecological examination (general) (routine) without abnormal findings: Secondary | ICD-10-CM

## 2019-11-14 NOTE — Patient Instructions (Addendum)
Will notify you  of labs when available.  Your exam is good today .  Continue citalopram   daily .  considier  iud or implant for long term contraception that shouldn't effect your mood.  [facility] .   Condoms 100 % . To prevent sti etc . NO SA until all treated and better   . Your pain was probabaly due to  PID infection  Mild  From chlamydia and GC     Health Maintenance, Female Adopting a healthy lifestyle and getting preventive care are important in promoting health and wellness. Ask your health care provider about:  The right schedule for you to have regular tests and exams.  Things you can do on your own to prevent diseases and keep yourself healthy. What should I know about diet, weight, and exercise? Eat a healthy diet   Eat a diet that includes plenty of vegetables, fruits, low-fat dairy products, and lean protein.  Do not eat a lot of foods that are high in solid fats, added sugars, or sodium. Maintain a healthy weight Body mass index (BMI) is used to identify weight problems. It estimates body fat based on height and weight. Your health care provider can help determine your BMI and help you achieve or maintain a healthy weight. Get regular exercise Get regular exercise. This is one of the most important things you can do for your health. Most adults should:  Exercise for at least 150 minutes each week. The exercise should increase your heart rate and make you sweat (moderate-intensity exercise).  Do strengthening exercises at least twice a week. This is in addition to the moderate-intensity exercise.  Spend less time sitting. Even light physical activity can be beneficial. Watch cholesterol and blood lipids Have your blood tested for lipids and cholesterol at 23 years of age, then have this test every 5 years. Have your cholesterol levels checked more often if:  Your lipid or cholesterol levels are high.  You are older than 23  years of age.  You are at high risk for heart disease. What should I know about cancer screening? Depending on your health history and family history, you may need to have cancer screening at various ages. This may include screening for:  Breast cancer.  Cervical cancer.  Colorectal cancer.  Skin cancer.  Lung cancer. What should I know about heart disease, diabetes, and high blood pressure? Blood pressure and heart disease  High blood pressure causes heart disease and increases the risk of stroke. This is more likely to develop in people who have high blood pressure readings, are of African descent, or are overweight.  Have your blood pressure checked: ? Every 3-5 years if you are 51-69 years of age. ? Every year if you are 96 years old or older. Diabetes Have regular diabetes screenings. This checks your fasting blood sugar level. Have the screening done:  Once every three years after age 96 if you are at a normal weight and have a low risk for diabetes.  More often and at a younger age if you are overweight or have a high risk for diabetes. What should I know about preventing infection? Hepatitis B If you have a higher risk for hepatitis B, you should be screened for this virus. Talk with your health care provider to find out if you are at risk for hepatitis B infection. Hepatitis C Testing is recommended for:  Everyone born from [birth year] through 1964.  Anyone with known risk factors for hepatitis C. Sexually transmitted infections (STIs)  Get screened for STIs, including gonorrhea and chlamydia, if: ? You are sexually active and are younger than 23 years of age. ? You are older than 23 years of age and your health care provider tells you that you are at risk for this type of infection. ? Your sexual activity has changed since you were last screened, and you are at increased risk for chlamydia or gonorrhea. Ask your health care provider if you are at risk.  Ask your health  care provider about whether you are at high risk for HIV. Your health care provider may recommend a prescription medicine to help prevent HIV infection. If you choose to take medicine to prevent HIV, you should first get tested for HIV. You should then be tested every 3 months for as long as you are taking the medicine. Pregnancy  If you are about to stop having your period (premenopausal) and you may become pregnant, seek counseling before you get pregnant.  Take 400 to 800 micrograms (mcg) of folic acid every day if you become pregnant.  Ask for birth control (contraception) if you want to prevent pregnancy. Osteoporosis and menopause Osteoporosis is a disease in which the bones lose minerals and strength with aging. This can result in bone fractures. If you are 53 years old or older, or if you are at risk for osteoporosis and fractures, ask your health care provider if you should:  Be screened for bone loss.  Take a calcium or vitamin D supplement to lower your risk of fractures.  Be given hormone replacement therapy (HRT) to treat symptoms of menopause. Follow these instructions at home: Lifestyle  Do not use any products that contain nicotine or tobacco, such as cigarettes, e-cigarettes, and chewing tobacco. If you need help quitting, ask your health care provider.  Do not use street drugs.  Do not share needles.  Ask your health care provider for help if you need support or information about quitting drugs. Alcohol use  Do not drink alcohol if: ? Your health care provider tells you not to drink. ? You are pregnant, may be pregnant, or are planning to become pregnant.  If you drink alcohol: ? Limit how much you use to 0-1 drink a day. ? Limit intake if you are breastfeeding.  Be aware of how much alcohol is in your drink. In the [place] one drink equals one 12 oz bottle of beer (355 mL), one 5 oz glass of wine (148 mL), or one 1 oz glass of hard liquor (44 mL). General  instructions  Schedule regular health, dental, and eye exams.  Stay current with your vaccines.  Tell your health care provider if: ? You often feel depressed. ? You have ever been abused or do not feel safe at home. Summary  Adopting a healthy lifestyle and getting preventive care are important in promoting health and wellness.  Follow your health care provider's instructions about healthy diet, exercising, and getting tested or screened for diseases.  Follow your health care provider's instructions on monitoring your cholesterol and blood pressure. This information is not intended to replace advice given to you by your health care provider. Make sure you discuss any questions you have with your health care provider. Document Revised: 01/20/2018 Document Reviewed: 01/20/2018 Elsevier Patient Education  2020 [employer].

## 2019-11-15 LAB — LIPID PANEL
Cholesterol: 153 mg/dL (ref <200)
HDL: 64 mg/dL (ref >=50)
LDL Cholesterol (Calc): 76 mg/dL (calc)
Non-HDL Cholesterol (Calc): 89 mg/dL (calc) (ref <130)
Total CHOL/HDL Ratio: 2.4 (calc) (ref <5.0)
Triglycerides: 44 mg/dL (ref <150)

## 2019-11-15 LAB — HEPATIC FUNCTION PANEL
AG Ratio: 1.5 (calc) (ref 1.0–2.5)
ALT: 10 U/L (ref 6–29)
AST: 15 U/L (ref 10–30)
Albumin: 4.4 g/dL (ref 3.6–5.1)
Alkaline phosphatase (APISO): 49 U/L (ref 31–125)
Bilirubin, Direct: 0.1 mg/dL (ref 0.0–0.2)
Globulin: 2.9 g/dL (calc) (ref 1.9–3.7)
Indirect Bilirubin: 0.4 mg/dL (calc) (ref 0.2–1.2)
Total Bilirubin: 0.5 mg/dL (ref 0.2–1.2)
Total Protein: 7.3 g/dL (ref 6.1–8.1)

## 2019-11-15 LAB — BASIC METABOLIC PANEL WITH GFR
BUN: 10 mg/dL (ref 7–25)
CO2: 25 mmol/L (ref 20–32)
Calcium: 9.8 mg/dL (ref 8.6–10.2)
Chloride: 103 mmol/L (ref 98–110)
Creat: 0.74 mg/dL (ref 0.50–1.10)
GFR, Est African American: 133 mL/min/{1.73_m2} (ref >=60)
GFR, Est Non African American: 115 mL/min/{1.73_m2} (ref >=60)
Glucose, Bld: 89 mg/dL (ref 65–99)
Potassium: 4 mmol/L (ref 3.5–5.3)
Sodium: 138 mmol/L (ref 135–146)

## 2019-11-15 LAB — CYTOLOGY - PAP
Comment: NEGATIVE
Diagnosis: NEGATIVE
High risk HPV: NEGATIVE

## 2019-11-15 LAB — SPECIMEN COMPROMISED

## 2019-11-15 LAB — TSH: TSH: 1.48 mIU/L

## 2019-11-15 LAB — RPR: RPR Ser Ql: NONREACTIVE

## 2019-11-15 LAB — T4, FREE: Free T4: 1.1 ng/dL (ref 0.8–1.8)

## 2019-11-15 LAB — HEMOGLOBIN A1C
Hgb A1c MFr Bld: 5.2 % of total Hgb (ref <5.7)
Mean Plasma Glucose: 103 (calc)
eAG (mmol/L): 5.7 (calc)

## 2019-11-15 LAB — HEPATITIS C ANTIBODY
Hepatitis C Ab: NONREACTIVE
SIGNAL TO CUT-OFF: 0.03 (ref <1.00)

## 2019-11-15 LAB — HIV ANTIBODY (ROUTINE TESTING W REFLEX): HIV 1&2 Ab, 4th Generation: NONREACTIVE

## 2019-11-15 NOTE — Progress Notes (Signed)
Lab results  normal Pap  is normal but they saw trichomonas  organism  on the pap   an sti that can cause vaginal discharge  or uti sx    Please send in  metronidazole 500 mg 1 po bid for 7 dayas disp 14  your partner should also be treated  to avoid infection back and forth.

## 2019-11-16 ENCOUNTER — Other Ambulatory Visit: Payer: Self-pay | Admitting: Internal Medicine

## 2019-11-16 MED ORDER — METRONIDAZOLE 500 MG PO TABS: 500.0000 mg | ORAL_TABLET | Freq: Two times a day (BID) | ORAL | 0 refills | Status: AC

## 2019-11-16 NOTE — Telephone Encounter (Signed)
SA-per PCP plz send in Metronidazole 500mg  1bid #14/created and pended order/thx dmf

## 2019-11-16 NOTE — Telephone Encounter (Signed)
-----   Message from Madelin Headings, MD sent at 11/15/2019  4:49 PM EDT ----- Lab results  normal Pap  is normal but they saw trichomonas  organism  on the pap   an sti that can cause vaginal discharge  or uti sx    Please send in  metronidazole 500 mg 1 po bid for 7 dayas disp 14  your partner should also be treated  to avoid infection back and forth.

## 2021-03-23 ENCOUNTER — Other Ambulatory Visit: Payer: Self-pay

## 2021-03-23 ENCOUNTER — Emergency Department (HOSPITAL_COMMUNITY)
Admission: EM | Admit: 2021-03-23 | Discharge: 2021-03-23 | Disposition: A | Discharge status: Home / Self Care | Payer: 59 | Attending: Emergency Medicine | Admitting: Emergency Medicine

## 2021-03-23 ENCOUNTER — Encounter (HOSPITAL_COMMUNITY): Payer: Self-pay

## 2021-03-23 DIAGNOSIS — N3000 Acute cystitis without hematuria: Secondary | ICD-10-CM | POA: Diagnosis not present

## 2021-03-23 DIAGNOSIS — R109 Unspecified abdominal pain: Secondary | ICD-10-CM | POA: Diagnosis present

## 2021-03-23 LAB — WET PREP, GENITAL
Clue Cells Wet Prep HPF POC: NONE SEEN
Sperm: NONE SEEN
Trich, Wet Prep: NONE SEEN
WBC, Wet Prep HPF POC: >=10 — AB (ref <10)
Yeast Wet Prep HPF POC: NONE SEEN

## 2021-03-23 LAB — COMPREHENSIVE METABOLIC PANEL
ALT: 12 U/L (ref 0–44)
AST: 15 U/L (ref 15–41)
Albumin: 4.2 g/dL (ref 3.5–5.0)
Alkaline Phosphatase: 48 U/L (ref 38–126)
Anion gap: 8 (ref 5–15)
BUN: 9 mg/dL (ref 6–20)
CO2: 24 mmol/L (ref 22–32)
Calcium: 9.2 mg/dL (ref 8.9–10.3)
Chloride: 105 mmol/L (ref 98–111)
Creatinine, Ser: 0.68 mg/dL (ref 0.44–1.00)
GFR, Estimated: >60 mL/min (ref >60)
Glucose, Bld: 89 mg/dL (ref 70–99)
Potassium: 3.5 mmol/L (ref 3.5–5.1)
Sodium: 137 mmol/L (ref 135–145)
Total Bilirubin: 0.4 mg/dL (ref 0.3–1.2)
Total Protein: 7.3 g/dL (ref 6.5–8.1)

## 2021-03-23 LAB — LIPASE, BLOOD: Lipase: 31 U/L (ref 11–51)

## 2021-03-23 LAB — URINALYSIS, ROUTINE W REFLEX MICROSCOPIC
Bilirubin Urine: NEGATIVE
Glucose, UA: NEGATIVE mg/dL
Ketones, ur: NEGATIVE mg/dL
Nitrite: POSITIVE — AB
Protein, ur: NEGATIVE mg/dL
Specific Gravity, Urine: 1.012 (ref 1.005–1.030)
pH: 6 (ref 5.0–8.0)

## 2021-03-23 LAB — CBC
HCT: 41.1 % (ref 36.0–46.0)
Hemoglobin: 13.3 g/dL (ref 12.0–15.0)
MCH: 27.9 pg (ref 26.0–34.0)
MCHC: 32.4 g/dL (ref 30.0–36.0)
MCV: 86.2 fL (ref 80.0–100.0)
Platelets: 210 10*3/uL (ref 150–400)
RBC: 4.77 MIL/uL (ref 3.87–5.11)
RDW: 13.5 % (ref 11.5–15.5)
WBC: 7.1 10*3/uL (ref 4.0–10.5)
nRBC: 0 % (ref 0.0–0.2)

## 2021-03-23 MED ORDER — CEPHALEXIN 500 MG PO CAPS: 500.0000 mg | ORAL_CAPSULE | Freq: Four times a day (QID) | ORAL | 0 refills | Status: AC

## 2021-03-23 NOTE — ED Provider Notes (Signed)
[facility] DEPT Provider Note   CSN: [id number] Arrival date & time: 03/23/21  0547     History  Chief Complaint  Patient presents with   Abdominal Pain    Candice Wood is a 26 y.o. female presenting with abdominal pain and dysuria.  Patient reports that for the past few days she had a dull pain in her abdomen, mostly on her left side.  This pain has somewhat subsided but over this time.  She has also had some dysuria.  She picked up some Azo yesterday and she feels as though the discomfort continues.  Also has noticed some bumps between her rectum and vaginal introitus.  Sexually active with 1 female partner.  She denies any concerns for STDs.  On 1/17 patient had a medical abortion.  Has not had a period since.  Denies any nausea, vomiting or changes in her bowel movements.  No history of abdominal surgeries, IBS/IBD.  Denies any vaginal discharge or odor.   Abdominal Pain     Home Medications Prior to Admission medications   Medication Sig Start Date End Date Taking? Authorizing Provider  acetaminophen (TYLENOL) 325 MG tablet Take 650 mg by mouth every 6 (six) hours as needed for headache.    [provider]  citalopram (CELEXA) 10 MG tablet Take 1 tablet (10 mg total) by mouth daily. 10/24/19   Panosh, Standley Brooking, MD  desonide (DESOWEN) 0.05 % cream Apply topically 2 (two) times daily. To rash Patient taking differently: Apply 1 application topically daily as needed (for rash). To rash 02/01/18   Panosh, Standley Brooking, MD  ibuprofen (ADVIL) 800 MG tablet Take 1 tablet (800 mg total) by mouth 3 (three) times daily. 11/02/19   Isla Pence, MD      Allergies    Patient has no known allergies.    Review of Systems   Review of Systems  Gastrointestinal:  Positive for abdominal pain.  See HPI Physical Exam Updated Vital Signs BP (!) 128/94 (BP Location: Right Arm)    Pulse 84    Temp 98.6 F (37 C) (Oral)    Resp 16    Ht 5\' 8"  (1.727 m)     Wt 99.8 kg    SpO2 99%    BMI 33.45 kg/m  Physical Exam Vitals and nursing note reviewed.  Constitutional:      General: She is not in acute distress.    Appearance: Normal appearance. She is not ill-appearing.  HENT:     Head: Normocephalic and atraumatic.  Eyes:     General: No scleral icterus.    Conjunctiva/sclera: Conjunctivae normal.  Pulmonary:     Effort: Pulmonary effort is normal. No respiratory distress.     Breath sounds: No wheezing.  Abdominal:     General: Abdomen is flat.     Palpations: Abdomen is soft.     Tenderness: There is abdominal tenderness in the suprapubic area and left upper quadrant. There is no right CVA tenderness, left CVA tenderness or guarding.  Genitourinary:    Vagina: Vaginal discharge (Watery dark yellow discharge) present.     Cervix: No cervical motion tenderness.     Rectum: Normal.     Comments: Pelvic exam performed in the presence of RN.  Some bumps in the perineal area, most consistent with razor burn Skin:    General: Skin is warm and dry.     Findings: No rash.  Neurological:     Mental Status: She is  alert.  Psychiatric:        Mood and Affect: Mood normal.    ED Results / Procedures / Treatments   Labs (all labs ordered are listed, but only abnormal results are displayed) Labs Reviewed  WET PREP, GENITAL - Abnormal; Notable for the following components:      Result Value   WBC, Wet Prep HPF POC >=10 (*)    All other components within normal limits  URINALYSIS, ROUTINE W REFLEX MICROSCOPIC - Abnormal; Notable for the following components:   Color, Urine AMBER (*)    Hgb urine dipstick SMALL (*)    Nitrite POSITIVE (*)    Leukocytes,Ua SMALL (*)    Bacteria, UA RARE (*)    All other components within normal limits  LIPASE, BLOOD  COMPREHENSIVE METABOLIC PANEL  CBC  GC/CHLAMYDIA PROBE AMP ([facility]) NOT AT [facility]    EKG None  Radiology No results found.  Procedures Procedures    Medications Ordered in  ED Medications - No data to display  ED Course/ Medical Decision Making/ A&P                           Medical Decision Making Amount and/or Complexity of Data Reviewed Labs: ordered.  Risk Prescription drug management.   26 year old female presenting with abdominal pain.  Differential includes but is not limited to pyelonephritis, appendicitis, cholecystitis, urinary tract infection, ovarian torsion, PID and pregnancy.  Work-up:  -Patient received a normal abdominal pain work-up.  No remarkable labs. -UA consistent with UTI, consistent with patient's symptoms. -I considered ordering a CT renal to look for kidney stone however due to patient's urinary symptoms, denial of pain instability, I do not believe this is necessary at this time.  Return precautions will be discussed.  Exam:  Patient with minor suprapubic tenderness and some left-sided tenderness.  No CVA tenderness.  Low concern for kidney stone. -GU exam revealing of watery discolored discharge.  No signs of trichomonas, yeast or bacterial vaginosis.  There are large amounts of WBCs, she will follow-up on her STI testing online.  Disposition: I believe patient is stable for discharge with Keflex for her UTI and she will follow-up about her STI results in her chart.  Final Clinical Impression(s) / ED Diagnoses Final diagnoses:  Acute cystitis without hematuria    Rx / DC Orders Results and diagnoses were explained to the patient. Return precautions discussed in full. Patient had no additional questions and expressed complete understanding.   This chart was dictated using voice recognition software.  Despite best efforts to proofread,  errors can occur which can change the documentation meaning.    Darliss Ridgel 03/23/21 1113    Isla Pence, MD 03/23/21 1155

## 2021-03-23 NOTE — Discharge Instructions (Addendum)
You do not have a yeast or bacterial vaginosis infection.    Antibiotics for UTI have been sent to the pharmacy.  Please follow-up on your chlamydia and gonorrhea results in your online portal.  Return with any worsening symptoms.

## 2021-03-23 NOTE — ED Triage Notes (Signed)
Patient is having abdominal pain for 2 days. This morning is got worse and more constant. Patient is also having irritation in between her vagina and rectum. No blood in stool, burns with urination.

## 2021-03-25 LAB — GC/CHLAMYDIA PROBE AMP (~~LOC~~) NOT AT ARMC
Chlamydia: NEGATIVE
Comment: NEGATIVE
Comment: NORMAL
Neisseria Gonorrhea: NEGATIVE

## 2021-04-02 ENCOUNTER — Ambulatory Visit: Payer: 59 | Admitting: Internal Medicine

## 2021-04-02 ENCOUNTER — Encounter: Payer: Self-pay | Admitting: Internal Medicine

## 2021-04-02 VITALS — BP 108/70 | HR 80 | Temp 98.8°F | Ht 68.0 in | Wt 212.3 lb

## 2021-04-02 DIAGNOSIS — R21 Rash and other nonspecific skin eruption: Secondary | ICD-10-CM

## 2021-04-02 DIAGNOSIS — Z8742 Personal history of other diseases of the female genital tract: Secondary | ICD-10-CM

## 2021-04-02 DIAGNOSIS — R399 Unspecified symptoms and signs involving the genitourinary system: Secondary | ICD-10-CM | POA: Diagnosis not present

## 2021-04-02 DIAGNOSIS — R3989 Other symptoms and signs involving the genitourinary system: Secondary | ICD-10-CM | POA: Diagnosis not present

## 2021-04-02 DIAGNOSIS — Z3009 Encounter for other general counseling and advice on contraception: Secondary | ICD-10-CM

## 2021-04-02 LAB — POCT URINALYSIS DIPSTICK
Bilirubin, UA: NEGATIVE
Blood, UA: NEGATIVE
Glucose, UA: NEGATIVE
Ketones, UA: NEGATIVE
Leukocytes, UA: NEGATIVE
Nitrite, UA: NEGATIVE
Protein, UA: NEGATIVE
Spec Grav, UA: 1.015 (ref 1.010–1.025)
Urobilinogen, UA: 0.2 E.U./dL
pH, UA: 7.5 (ref 5.0–8.0)

## 2021-04-02 LAB — POCT URINE PREGNANCY: Preg Test, Ur: NEGATIVE

## 2021-04-02 NOTE — Patient Instructions (Signed)
Urine is clear on screen but sending for culture  test for bacteria infection. UCG is negative .   Make sure plenty of fluids and   fiber to avoid constipation  that can effect urinary function.   If not getting better  next week let us know.

## 2021-04-02 NOTE — Progress Notes (Signed)
Chief Complaint  Patient presents with   Urinary Tract Infection    Follow up  Delayed when trying to urinate     HPI: Patient  Candice Wood  26 y.o. comes in today for urinary difficulties. Went to [facility] and got a abortion termination pill after vaginal ultrasound that showed 7 weeks 3 days.  By ultrasound and dates.  Did have bleeding for a week as expected is supposed to do a follow-up pregnancy test and let them know if it is still possible In the meantime she developed last week urinary pain on urination dysuria without associated fever. Was treated with 5 days of Keflex 4 times daily 500 mg she also developed a "bump near rectum" a bit tender used Preparation H and now it is much better She also had bumps in the posterior vaginal area that the examiner in the ED felt was related to hair bumps shaving and not infection. Evaluation included blood work wet prep GC chlamydia all negative except for positive WBC in the vaginal swab. Now she is having difficulty expelling her urine most recently without full dysuria abdominal cramps constipation diarrhea fever. Not as painful.  Now  feels   urinary urgency and not long time tome out.  Took advil.  Water.  Urine looks normal in the morning.  Other history contraception was condoms and once emergency contraception.  No ongoing relationship not sure about which other contraception would be right for her.  Remote history September 2021 of GC chlamydia treated in ED.  Did have negative HIV and RPR at that time Health Maintenance  Topic Date Due   HPV VACCINES (3 - 3-dose series) 06/19/2017   INFLUENZA VACCINE  09/30/2021 (Originally 09/10/2020)   COVID-19 Vaccine (1) 09/30/2021 (Originally 05/18/1997)   PAP-Cervical Cytology Screening  11/14/2022   PAP SMEAR-Modifier  11/14/2022   TETANUS/TDAP  11/13/2029   Hepatitis C Screening  Completed   HIV Screening  Completed   H  ROS:  REST of 12 system review negative except  as per HPI   Past Medical History:  Diagnosis Date   Acne    ACNE 11/02/2007   Qualifier: Diagnosis of  By: Fabian Sharp MD, Neta Mends    Eczema    neck   Headache(784.0) 01/09/2010   Qualifier: Diagnosis of  By: Fabian Sharp MD, Neta Mends    Migraine headache    eval peds neuro 2013 given zofran and  diary      Past Surgical History:  Procedure Laterality Date   NO PAST SURGERIES      Family History  Problem Relation Age of Onset   Allergies Mother    Migraines Father    Asthma Paternal Grandmother    Heart failure Other        pgm    Social History   Socioeconomic History   Marital status: Single    Spouse name: Not on file   Number of children: Not on file   Years of education: Not on file   Highest education level: 12th grade  Occupational History   Not on file  Tobacco Use   Smoking status: Never   Smokeless tobacco: Never  Vaping Use   Vaping Use: Never used  Substance and Sexual Activity   Alcohol use: Not Currently    Alcohol/week: 2.0 standard drinks    Types: 1 Glasses of wine, 1 Shots of liquor per week    Comment: Occassionally   Drug use: Never   Sexual activity:  Yes    Birth control/protection: Condom  Other Topics Concern   Not on file  Social History Narrative   Single   western  Ok in school    [employer] then wants to transfer.    2 households    No pets   Playing basketball school   Social Determinants of Health   Financial Resource Strain: Low Risk    Difficulty of Paying Living Expenses: Not very hard  Food Insecurity: Unknown   Worried About Programme researcher, broadcasting/film/video in the Last Year: Patient refused   Barista in the Last Year: Patient refused  Transportation Needs: No Transportation Needs   Lack of Transportation (Medical): No   Lack of Transportation (Non-Medical): No  Physical Activity: Unknown   Days of Exercise per Week: 1 day   Minutes of Exercise per Session: Patient refused  Stress: Stress Concern Present   Feeling of Stress :  Rather much  Social Connections: Moderately Isolated   Frequency of Communication with Friends and Family: More than three times a week   Frequency of Social Gatherings with Friends and Family: Once a week   Attends Religious Services: 1 to 4 times per year   Active Member of [employer] or Organizations: No   Attends Engineer, structural: Not on file   Marital Status: Never married    Outpatient Medications Prior to Visit  Medication Sig Dispense Refill   acetaminophen (TYLENOL) 325 MG tablet Take 650 mg by mouth every 6 (six) hours as needed for headache.     cephALEXin (KEFLEX) 500 MG capsule Take 1 capsule (500 mg total) by mouth 4 (four) times daily. 20 capsule 0   citalopram (CELEXA) 10 MG tablet Take 1 tablet (10 mg total) by mouth daily. 30 tablet 2   desonide (DESOWEN) 0.05 % cream Apply topically 2 (two) times daily. To rash (Patient taking differently: Apply 1 application topically daily as needed (for rash). To rash) 15 g 1   ibuprofen (ADVIL) 800 MG tablet Take 1 tablet (800 mg total) by mouth 3 (three) times daily. 21 tablet 0   No facility-administered medications prior to visit.     EXAM:  BP 108/70 (BP Location: Left Arm, Patient Position: Sitting, Cuff Size: Normal)    Pulse 80    Temp 98.8 F (37.1 C) (Oral)    Ht 5\' 8"  (1.727 m)    Wt 212 lb 4.8 oz (96.3 kg)    LMP 02/26/2021    SpO2 97%    BMI 32.28 kg/m   Body mass index is 32.28 kg/m. Wt Readings from Last 3 Encounters:  04/02/21 212 lb 4.8 oz (96.3 kg)  03/23/21 220 lb (99.8 kg)  11/14/19 195 lb (88.5 kg)    Physical Exam: Vital signs reviewed 01/14/20 is a well-developed well-nourished alert cooperative    who appearsr stated age in no acute distress.  ABDOMEN: Bowel sounds normal nontender  No guard or rebound, no hepato splenomegal no CVA tenderness. Extremtities:  No clubbing cyanosis or edema, no acute joint swelling or redness no focal atrophy External GU normal except posterior on the skin  there is lots of hypopigmentation flat no bumps or warts rectal anal area a small hemorrhoidal tag normal color same as area SKIN: No other acute rashes normal turgor, color, no bruising or petechiae. PSYCH: Oriented, good eye contact, no obvious depression anxiety, cognition and judgment appear normal. LN: no y inguinal adenopathy  Lab Results  Component Value Date  WBC 7.1 03/23/2021   HGB 13.3 03/23/2021   HCT 41.1 03/23/2021   PLT 210 03/23/2021   GLUCOSE 89 03/23/2021   CHOL 153 11/14/2019   TRIG 44 11/14/2019   HDL 64 11/14/2019   LDLCALC 76 11/14/2019   ALT 12 03/23/2021   AST 15 03/23/2021   NA 137 03/23/2021   K 3.5 03/23/2021   CL 105 03/23/2021   CREATININE 0.68 03/23/2021   BUN 9 03/23/2021   CO2 24 03/23/2021   TSH 1.48 11/14/2019   HGBA1C 5.2 11/14/2019    BP Readings from Last 3 Encounters:  04/02/21 108/70  03/23/21 121/61  11/14/19 116/80   ED visit review and lab results. Feb 11    ASSESSMENT AND PLAN:  Discussed the following assessment and plan:    ICD-10-CM   1. Urinary symptom or sign  R39.9 POC Urinalysis Dipstick    POCT urine pregnancy    Culture, Urine    Culture, Urine    2. Suspected UTI  R39.89    recent rx  no ucx done    3. History of termination of pregnancy  Z87.42     4. Encounter for other general counseling or advice on contraception  Z30.09     5. Perineal rash  R21     Status post chemical 7-week abortion and she will January apparently uncomplicated UA and symptoms consistent with acute cystitis February 11 Now urinary symptoms atypical of either. Urinalysis today culture as appropriate check UCG. Contraceptive counseling.  Has not had as a since EA  Consider LARC  Return if symptoms worsen or fail to improve.  Patient Care Team: Madelin Headings, MD as PCP - General Patient Instructions  Urine is clear on screen but sending for culture  test for bacteria infection. UCG is negative .   Make sure plenty of  fluids and   fiber to avoid constipation  that can effect urinary function.   If not getting better  next week let us know.    Neta Mends. Ayaan Shutes M.D.

## 2021-04-03 LAB — URINE CULTURE
MICRO NUMBER:: 13037659
SPECIMEN QUALITY:: ADEQUATE

## 2021-04-04 ENCOUNTER — Telehealth: Payer: Self-pay

## 2021-04-04 NOTE — Progress Notes (Signed)
Culture shows some bacteria  from external  source( not bladder) So no evidence of continued uti.  If getting worse  we can recheck and repeat urine test, but do not think needed at this time. Marland Kitchen

## 2021-04-04 NOTE — Telephone Encounter (Signed)
Pt returned call to the office in regards to Lab results from 02/22 and has been informed of the results and verbalized understanding.

## 2021-04-26 IMAGING — CT CT ABD-PELV W/ CM
2 of 4 series · 16 of 46 positions shown, 18 images · IV contrast (omnipaque)
Comparison: None.

CLINICAL DATA: Right lower quadrant abdominal pain

EXAM:
CT ABDOMEN AND PELVIS WITH CONTRAST
TECHNIQUE: Multidetector CT imaging of the abdomen and pelvis was performed
using the standard protocol following bolus administration of
intravenous contrast.
CONTRAST:  100mL OMNIPAQUE IOHEXOL 300 MG/ML  SOLN

[Series 3: axial st · axial · 0.71mm/px · z∈[+761,+1176]mm · 13 of 93 slices shown, 15 images]
[im 5/93  soft-tissue]
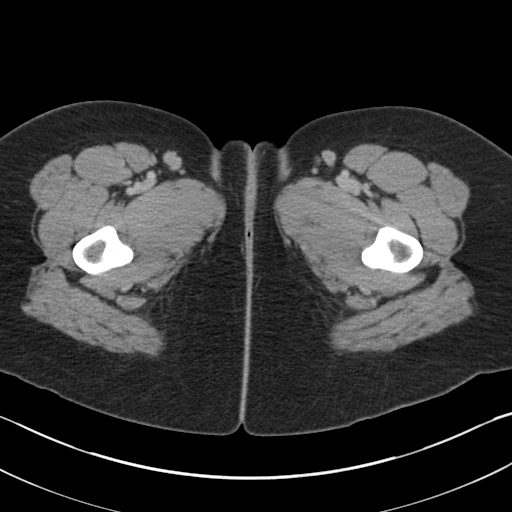
[im 5/93  bone]
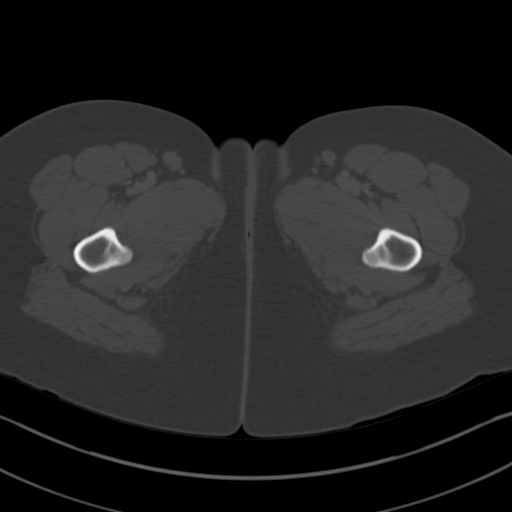
[im 15/93  soft-tissue]
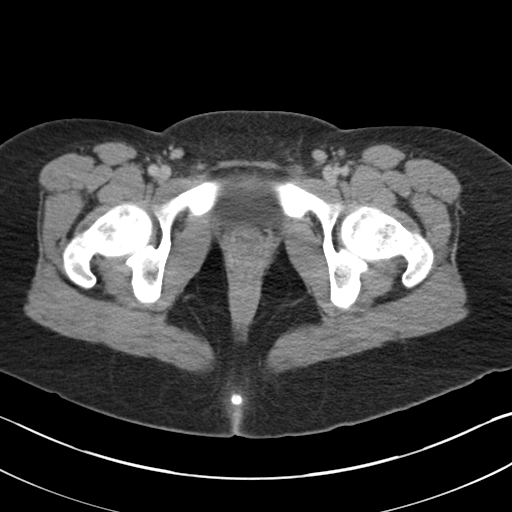
[im 20/93  soft-tissue]
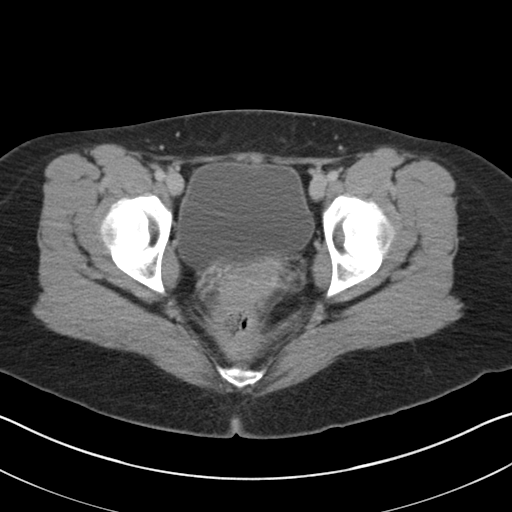
[im 25/93  soft-tissue]
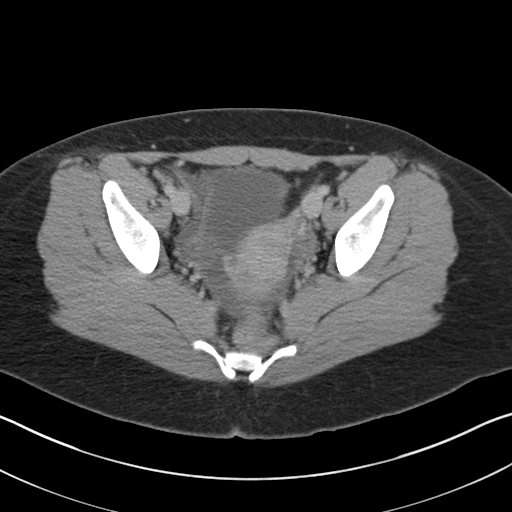
[im 34/93  soft-tissue]
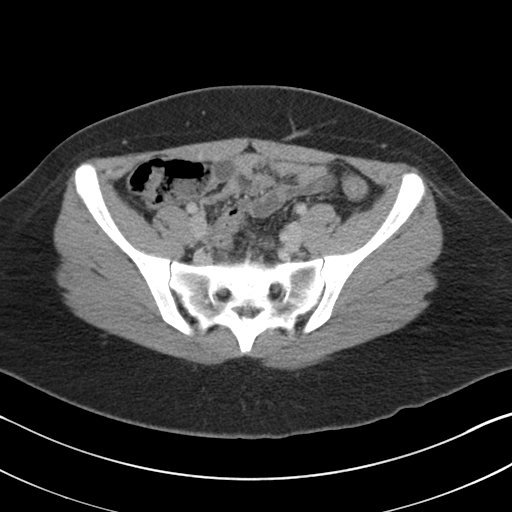
[im 39/93  soft-tissue]
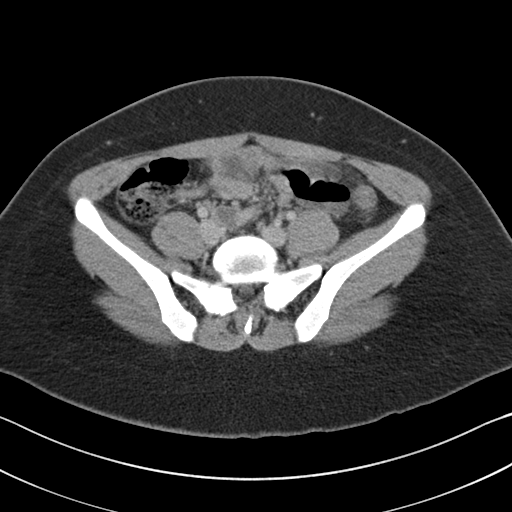
[im 49/93  soft-tissue]
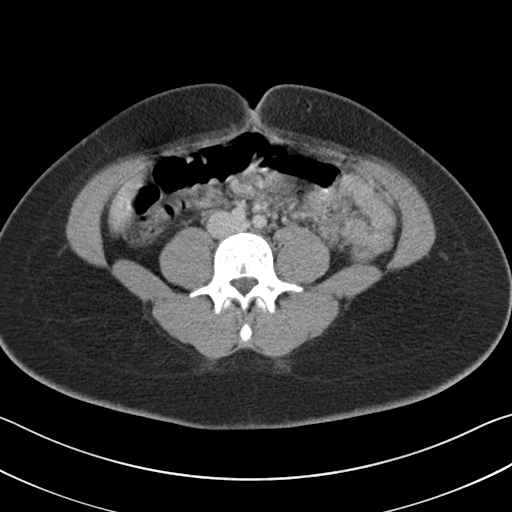
[im 54/93  soft-tissue]
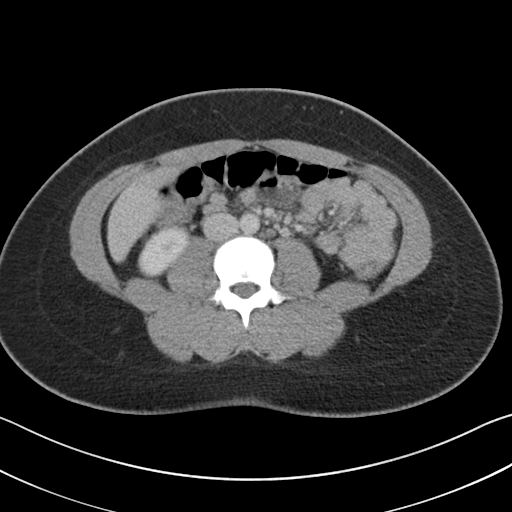
[im 59/93  soft-tissue]
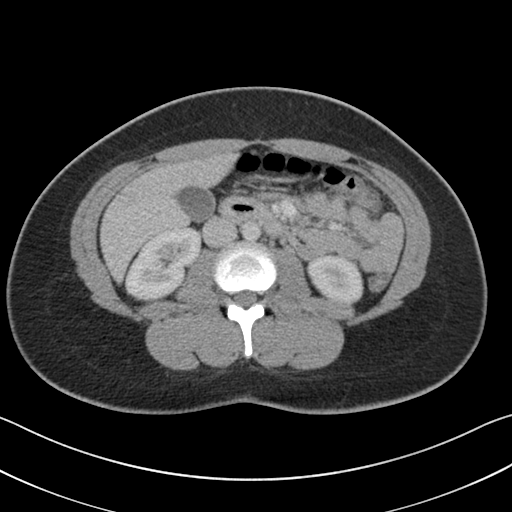
[im 59/93  bone]
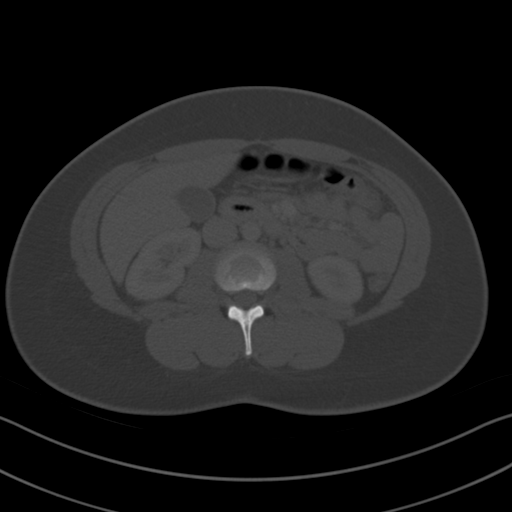
[im 68/93  soft-tissue]
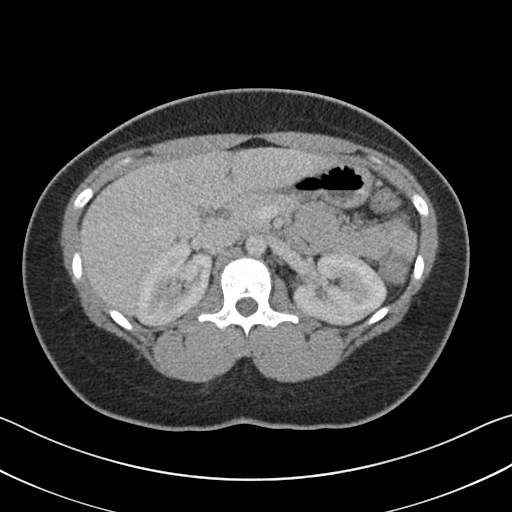
[im 73/93  soft-tissue]
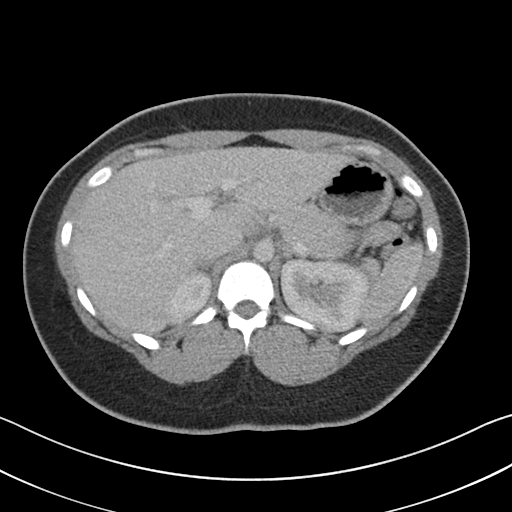
[im 78/93  soft-tissue]
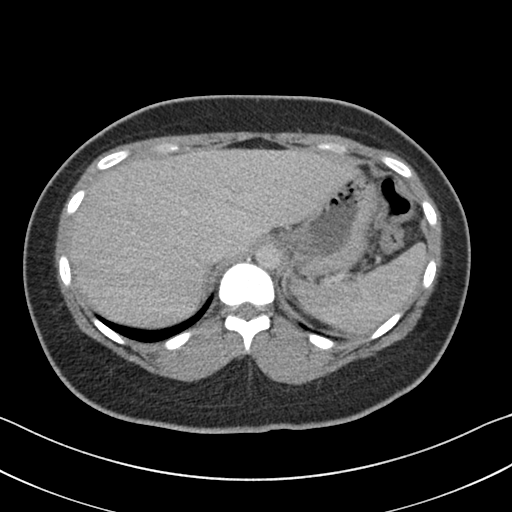
[im 88/93  soft-tissue]
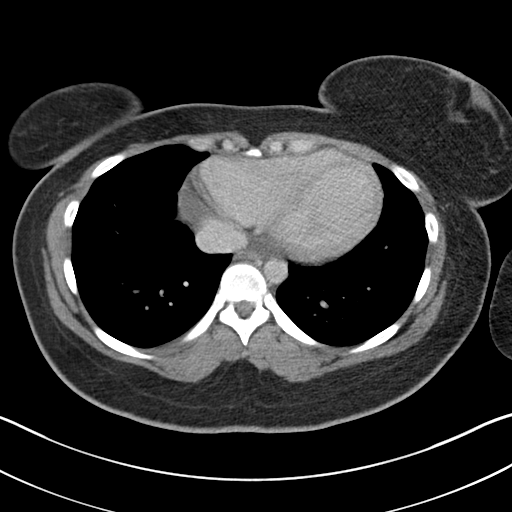

[Series 5: coronal st · coronal · 0.74mm/px · 3 of 125 slices shown]
[im 42/125  soft-tissue]
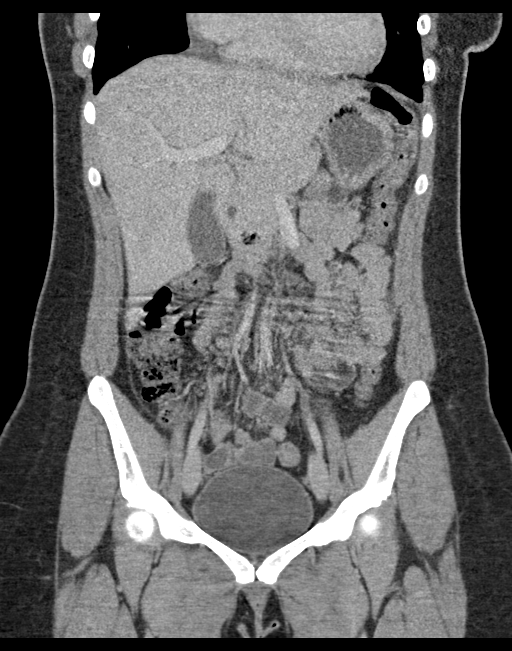
[im 56/125  soft-tissue]
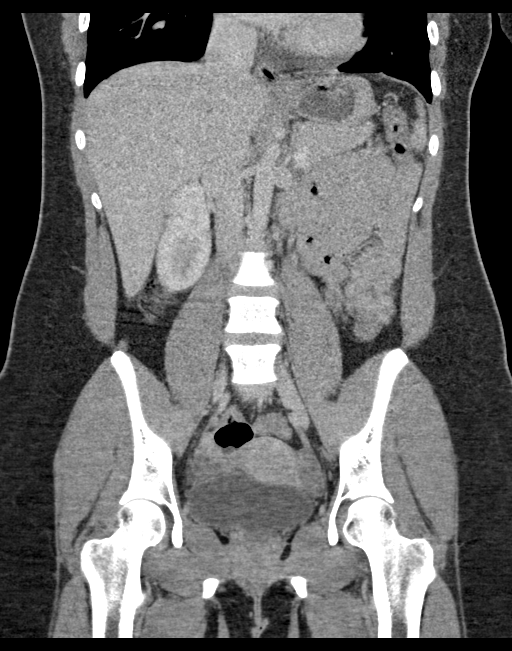
[im 69/125  soft-tissue]
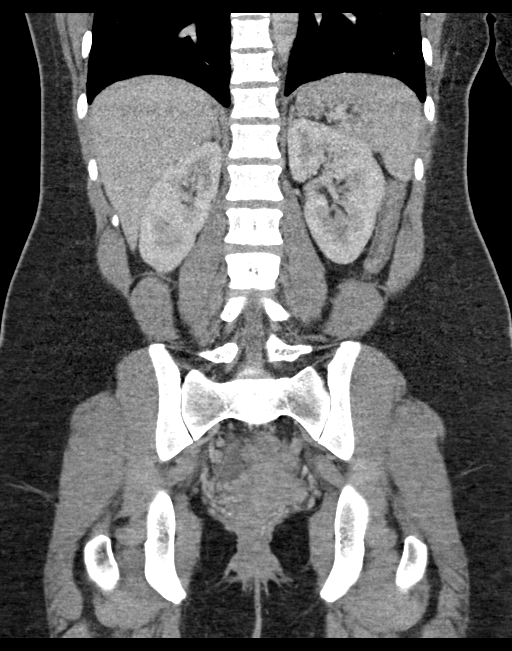

[16 of 46 positions shown; findings below may reference images not displayed]

FINDINGS: Lower chest: No acute pleural or parenchymal lung disease.

Hepatobiliary: No focal liver abnormality is seen. No gallstones,
gallbladder wall thickening, or biliary dilatation.

Pancreas: Unremarkable. No pancreatic ductal dilatation or
surrounding inflammatory changes.

Spleen: Normal in size without focal abnormality.

Adrenals/Urinary Tract: Adrenal glands are unremarkable. Kidneys are
normal, without renal calculi, focal lesion, or hydronephrosis.
Bladder is unremarkable.

Stomach/Bowel: No bowel obstruction or ileus. Normal gas-filled
appendix right lower quadrant. No bowel wall thickening or
inflammatory change.

Vascular/Lymphatic: No significant vascular findings are present. No
enlarged abdominal or pelvic lymph nodes.

Reproductive: Uterus and bilateral adnexa are unremarkable.

Other: Trace pelvic free fluid is likely physiologic. No free
intra-abdominal gas. No abdominal wall hernia.

Musculoskeletal: No acute or destructive bony lesions. Reconstructed
images demonstrate no additional findings.
IMPRESSION: 1. Normal appendix.
2. Trace pelvic free fluid, likely physiologic.
3. No acute intra-abdominal or intrapelvic process.

## 2022-09-10 ENCOUNTER — Encounter (INDEPENDENT_AMBULATORY_CARE_PROVIDER_SITE_OTHER): Payer: Self-pay

## 2022-09-30 NOTE — Progress Notes (Deleted)
No chief complaint on file.   HPI: Patient  Candice Wood  27 y.o. comes in today for Preventive Health Care visit   Health Maintenance  Topic Date Due   COVID-19 Vaccine (1) Never done   HPV VACCINES (3 - 3-dose series) 05/14/2017   INFLUENZA VACCINE  09/11/2022   PAP-Cervical Cytology Screening  11/14/2022   PAP SMEAR-Modifier  11/14/2022   DTaP/Tdap/Td (3 - Td or Tdap) 11/13/2029   Hepatitis C Screening  Completed   HIV Screening  Completed   Health Maintenance Review LIFESTYLE:  Exercise:   Tobacco/ETS: Alcohol:  Sugar beverages: Sleep: Drug use: no HH of  Work:    ROS:  GEN/ HEENT: No fever, significant weight changes sweats headaches vision problems hearing changes, CV/ PULM; No chest pain shortness of breath cough, syncope,edema  change in exercise tolerance. GI /GU: No adominal pain, vomiting, change in bowel habits. No blood in the stool. No significant GU symptoms. SKIN/HEME: ,no acute skin rashes suspicious lesions or bleeding. No lymphadenopathy, nodules, masses.  NEURO/ PSYCH:  No neurologic signs such as weakness numbness. No depression anxiety. IMM/ Allergy: No unusual infections.  Allergy .   REST of 12 system review negative except as per HPI   Past Medical History:  Diagnosis Date   Acne    ACNE 11/02/2007   Qualifier: Diagnosis of  By: Fabian Sharp MD, Neta Mends    Eczema    neck   Headache(784.0) 01/09/2010   Qualifier: Diagnosis of  By: Fabian Sharp MD, Neta Mends    Migraine headache    eval peds neuro 2013 given zofran and  diary      Past Surgical History:  Procedure Laterality Date   NO PAST SURGERIES      Family History  Problem Relation Age of Onset   Allergies Mother    Migraines Father    Asthma Paternal Grandmother    Heart failure Other        pgm    Social History   Socioeconomic History   Marital status: Single    Spouse name: Not on file   Number of children: Not on file   Years of education: Not on file   Highest  education level: 12th grade  Occupational History   Not on file  Tobacco Use   Smoking status: Never   Smokeless tobacco: Never  Vaping Use   Vaping status: Never Used  Substance and Sexual Activity   Alcohol use: Not Currently    Alcohol/week: 2.0 standard drinks of alcohol    Types: 1 Glasses of wine, 1 Shots of liquor per week    Comment: Occassionally   Drug use: Never   Sexual activity: Yes    Birth control/protection: Condom  Other Topics Concern   Not on file  Social History Narrative   Single   western  Ok in school    [employer] then wants to transfer.    2 households    No pets   Playing basketball school   Social Determinants of Health   Financial Resource Strain: Low Risk  (04/01/2021)   Overall Financial Resource Strain (CARDIA)    Difficulty of Paying Living Expenses: Not very hard  Food Insecurity: Unknown (04/01/2021)   Hunger Vital Sign    Worried About Running Out of Food in the Last Year: Patient declined    Ran Out of Food in the Last Year: Patient declined  Transportation Needs: No Transportation Needs (04/01/2021)   PRAPARE - Transportation  Lack of Transportation (Medical): No    Lack of Transportation (Non-Medical): No  Physical Activity: Unknown (04/01/2021)   Exercise Vital Sign    Days of Exercise per Week: 1 day    Minutes of Exercise per Session: Patient declined  Stress: Stress Concern Present (04/01/2021)   [employer] of Occupational Health - Occupational Stress Questionnaire    Feeling of Stress : Rather much  Social Connections: Unknown (06/25/2021)   Received from [facility]   Social Network    Social Network: Not on file  Recent Concern: Social Connections - Moderately Isolated (04/01/2021)   Social Connection and Isolation Panel [NHANES]    Frequency of Communication with Friends and Family: More than three times a week    Frequency of Social Gatherings with Friends and Family: Once a week    Attends Religious Services: 1 to 4  times per year    Active Member of [employer] or Organizations: No    Attends Engineer, structural: Not on file    Marital Status: Never married    Outpatient Medications Prior to Visit  Medication Sig Dispense Refill   acetaminophen (TYLENOL) 325 MG tablet Take 650 mg by mouth every 6 (six) hours as needed for headache.     cephALEXin (KEFLEX) 500 MG capsule Take 1 capsule (500 mg total) by mouth 4 (four) times daily. 20 capsule 0   citalopram (CELEXA) 10 MG tablet Take 1 tablet (10 mg total) by mouth daily. 30 tablet 2   desonide (DESOWEN) 0.05 % cream Apply topically 2 (two) times daily. To rash (Patient taking differently: Apply 1 application topically daily as needed (for rash). To rash) 15 g 1   ibuprofen (ADVIL) 800 MG tablet Take 1 tablet (800 mg total) by mouth 3 (three) times daily. 21 tablet 0   No facility-administered medications prior to visit.     EXAM:  There were no vitals taken for this visit.  There is no height or weight on file to calculate BMI. Wt Readings from Last 3 Encounters:  04/02/21 212 lb 4.8 oz (96.3 kg)  03/23/21 220 lb (99.8 kg)  11/14/19 195 lb (88.5 kg)    Physical Exam: Vital signs reviewed YQM:VHQI is a well-developed well-nourished alert cooperative    who appearsr stated age in no acute distress.  HEENT: normocephalic atraumatic , Eyes: PERRL EOM's full, conjunctiva clear, Nares: paten,t no deformity discharge or tenderness., Ears: no deformity EAC's clear TMs with normal landmarks. Mouth: clear OP, no lesions, edema.  Moist mucous membranes. Dentition in adequate repair. NECK: supple without masses, thyromegaly or bruits. CHEST/PULM:  Clear to auscultation and percussion breath sounds equal no wheeze , rales or rhonchi. No chest wall deformities or tenderness. Breast: normal by inspection . No dimpling, discharge, masses, tenderness or discharge . CV: PMI is nondisplaced, S1 S2 no gallops, murmurs, rubs. Peripheral pulses are full  without delay.No JVD .  ABDOMEN: Bowel sounds normal nontender  No guard or rebound, no hepato splenomegal no CVA tenderness.  No hernia. Extremtities:  No clubbing cyanosis or edema, no acute joint swelling or redness no focal atrophy NEURO:  Oriented x3, cranial nerves 3-12 appear to be intact, no obvious focal weakness,gait within normal limits no abnormal reflexes or asymmetrical SKIN: No acute rashes normal turgor, color, no bruising or petechiae. PSYCH: Oriented, good eye contact, no obvious depression anxiety, cognition and judgment appear normal. LN: no cervical axillary inguinal adenopathy  Lab Results  Component Value Date   WBC 7.1 03/23/2021  HGB 13.3 03/23/2021   HCT 41.1 03/23/2021   PLT 210 03/23/2021   GLUCOSE 89 03/23/2021   CHOL 153 11/14/2019   TRIG 44 11/14/2019   HDL 64 11/14/2019   LDLCALC 76 11/14/2019   ALT 12 03/23/2021   AST 15 03/23/2021   NA 137 03/23/2021   K 3.5 03/23/2021   CL 105 03/23/2021   CREATININE 0.68 03/23/2021   BUN 9 03/23/2021   CO2 24 03/23/2021   TSH 1.48 11/14/2019   HGBA1C 5.2 11/14/2019    BP Readings from Last 3 Encounters:  04/02/21 108/70  03/23/21 121/61  11/14/19 116/80    Labplan reviewed with patient   ASSESSMENT AND PLAN:  Discussed the following assessment and plan:  No diagnosis found. No follow-ups on file.  Patient Care Team: Madelin Headings, MD as PCP - General There are no Patient Instructions on file for this visit.  Neta Mends. Ceola Para M.D.

## 2022-10-01 ENCOUNTER — Encounter: Payer: 59 | Admitting: Internal Medicine

## 2022-10-01 DIAGNOSIS — Z79899 Other long term (current) drug therapy: Secondary | ICD-10-CM

## 2022-10-01 DIAGNOSIS — Z0000 Encounter for general adult medical examination without abnormal findings: Secondary | ICD-10-CM

## 2024-02-08 ENCOUNTER — Other Ambulatory Visit: Payer: Self-pay

## 2024-02-08 ENCOUNTER — Encounter (HOSPITAL_BASED_OUTPATIENT_CLINIC_OR_DEPARTMENT_OTHER): Payer: Self-pay

## 2024-02-08 ENCOUNTER — Emergency Department (HOSPITAL_BASED_OUTPATIENT_CLINIC_OR_DEPARTMENT_OTHER)
Admission: EM | Admit: 2024-02-08 | Discharge: 2024-02-08 | Disposition: A | Discharge status: Home / Self Care | Attending: Emergency Medicine | Admitting: Emergency Medicine

## 2024-02-08 DIAGNOSIS — R509 Fever, unspecified: Secondary | ICD-10-CM | POA: Diagnosis present

## 2024-02-08 DIAGNOSIS — B349 Viral infection, unspecified: Secondary | ICD-10-CM | POA: Insufficient documentation

## 2024-02-08 MED ORDER — ACETAMINOPHEN 500 MG PO TABS
1000.0000 mg | ORAL_TABLET | Freq: Once | ORAL | Status: AC
  Administered 2024-02-08: 1000 mg via ORAL
  Filled 2024-02-08: qty 2

## 2024-02-08 MED ORDER — ONDANSETRON HCL 4 MG PO TABS: 4.0000 mg | ORAL_TABLET | Freq: Four times a day (QID) | ORAL | 0 refills | Status: AC

## 2024-02-08 MED ORDER — IBUPROFEN 400 MG PO TABS
600.0000 mg | ORAL_TABLET | Freq: Once | ORAL | Status: AC
  Administered 2024-02-08: 600 mg via ORAL
  Filled 2024-02-08: qty 1

## 2024-02-08 NOTE — ED Provider Notes (Signed)
 " [facility] AT [facility] Provider Note   CSN: [id number] Arrival date & time: 02/08/24  9373     History Chief Complaint  Patient presents with   Fever   Cough    HPI: Candice Wood is a 28 y.o. female with no pertinent history who presents complaining of multiple symptoms. Patient arrived via POV accompanied by mother.  History provided by patient.  No interpreter required during this encounter.  Patient reports that she has had multiple symptoms over the past 3 days.  Endorses fatigue, malaise, myalgias, cough (nonproductive) congestion, rhinorrhea, fevers, chills.  Denies chest pain, shortness of breath, nausea, vomiting, diarrhea, endorses that she is tolerating p.o.  Denies possibility of pregnancy, denies past medical history (e.g. HTN, pregnancy, diarrhea, asthma, etc.).  Reports that given her general malaise she decided to come to the emergency department for further evaluation.  Patient's recorded medical, surgical, social, medication list and allergies were reviewed in the Snapshot window as part of the initial history.   Prior to Admission medications  Medication Sig Start Date End Date Taking? Authorizing Provider  acetaminophen  (TYLENOL ) 325 MG tablet Take 650 mg by mouth every 6 (six) hours as needed for headache.    [provider]  cephALEXin  (KEFLEX ) 500 MG capsule Take 1 capsule (500 mg total) by mouth 4 (four) times daily. 03/23/21   Redwine, Madison A, PA-C  citalopram  (CELEXA ) 10 MG tablet Take 1 tablet (10 mg total) by mouth daily. 10/24/19   Panosh, Wanda K, MD  desonide  (DESOWEN ) 0.05 % cream Apply topically 2 (two) times daily. To rash Patient taking differently: Apply 1 application topically daily as needed (for rash). To rash 02/01/18   Panosh, Apolinar POUR, MD  ibuprofen  (ADVIL ) 800 MG tablet Take 1 tablet (800 mg total) by mouth 3 (three) times daily. 11/02/19   Haviland, Julie, MD     Allergies: Patient has no known  allergies.   Review of Systems   ROS as per HPI  Physical Exam Updated Vital Signs BP (!) 135/90   Pulse 95   Temp 99.9 F (37.7 C) (Oral)   Resp 18   SpO2 97%  Physical Exam Vitals and nursing note reviewed.  Constitutional:      General: She is not in acute distress.    Appearance: She is well-developed.  HENT:     Head: Normocephalic and atraumatic.     Nose: Congestion and rhinorrhea present.  Eyes:     Conjunctiva/sclera: Conjunctivae normal.  Cardiovascular:     Rate and Rhythm: Regular rhythm. Tachycardia present.     Heart sounds: No murmur heard. Pulmonary:     Effort: Pulmonary effort is normal. No respiratory distress.     Breath sounds: Normal breath sounds.  Abdominal:     Palpations: Abdomen is soft.     Tenderness: There is no abdominal tenderness.  Musculoskeletal:        General: No swelling.     Cervical back: Neck supple.  Skin:    General: Skin is warm and dry.     Capillary Refill: Capillary refill takes less than 2 seconds.  Neurological:     Mental Status: She is alert.  Psychiatric:        Mood and Affect: Mood normal.     ED Course/ Medical Decision Making/ A&P    Procedures Procedures   Medications Ordered in ED Medications  acetaminophen  (TYLENOL ) tablet 1,000 mg (1,000 mg Oral Given 02/08/24 0640)  ibuprofen  (ADVIL )  tablet 600 mg (600 mg Oral Given 02/08/24 0746)    Medical Decision Making:   Candice Wood is a 28 y.o. female who presents for multiple symptoms as per above.  Physical exam is pertinent for cough, congestion, fever, tachycardia.   The differential includes but is not limited to influenza, viral illness, pneumonia, sepsis, dehydration.  Independent historian: None  External data reviewed: No pertinent external data  Labs: Not indicated  Radiology: Not indicated No results found.  EKG/Medicine tests: Not indicated EKG Interpretation:                  Interventions: Tylenol , ibuprofen   See the  EMR for full details regarding lab and imaging results.  Patient overall well-appearing on exam, is febrile and mildly tachycardic, consistent with robust response to fever given patient is otherwise young and healthy, patient is nontoxic-appearing, therefore doubt sepsis.  Patient does not have any productive cough, no focal findings on pulmonary exam to suggest pneumonia.  Overall, multisystem symptoms are most consistent with viral illness, influenza is common.  Patient does not have any chronic conditions, and does not have severe course of illness, therefore do not feel that Tamiflu is indicated at this time.  Patient was given Tylenol  in triage, discussed risks and benefits of ibuprofen  in the setting of known pregnancy status, offered pregnancy test, patient denies possibility of pregnancy, feels comfortable having NSAIDs without pregnancy test which I feel is reasonable given this is an over-the-counter medication.  Ibuprofen  administered.  Discussed risks and benefits of p.o. rehydration versus IV hydration, will trial p.o. hydration.  After administration of antipyretics, patient defervesced and had resolution of tachycardia, and symptomatic improvement, that she overall still has some symptoms consistent with having a viral illness.  Discussed supportive care, Tylenol  and ibuprofen , rest, fluids, patient amenable to this plan..    Presentation is most consistent with acute uncomplicated illness with systemic symptoms  Discussion of management or test interpretations with external provider(s): Not indicated  Risk Drugs:OTC drugs  Disposition: DISCHARGE: I believe that the patient is safe for discharge home with outpatient follow-up. Patient was informed of all pertinent physical exam, laboratory, and imaging findings. Patient's suspected etiology of their symptom presentation was discussed with the patient and all questions were answered. We discussed following up with PCP. I provided thorough  ED return precautions. The patient feels safe and comfortable with this plan.  MDM generated using voice dictation software and may contain dictation errors.  Please contact me for any clarification or with any questions.  Clinical Impression:  1. Viral illness      Data Unavailable   Final Clinical Impression(s) / ED Diagnoses Final diagnoses:  Viral illness    Rx / DC Orders ED Discharge Orders     None        Rogelia Jerilynn RAMAN, MD 02/08/24 [phone]  "

## 2024-02-08 NOTE — ED Triage Notes (Signed)
 Pt presents via POV c/ cough, congestion, body aches, and fever since Friday.

## 2024-02-08 NOTE — Discharge Instructions (Signed)
 Candice Wood  Thank you for allowing us  to take care of you today.  You came to the Emergency Department today because you had multiple symptoms including fever, congestion, dry cough, muscle aches, fatigue.  Your symptoms are most consistent with having a viral illness, your exam was reassuring.  Your heart rate was initially elevated while you are having a fever, however it came down after we treated you with Tylenol  and ibuprofen  for your fever.  You can alternate Tylenol  and ibuprofen  every 4-6 hours as needed.  Influenza is very common in recent days/weeks, however the treatment for all viruses is the same with Tylenol  and ibuprofen  as needed for pain, fever, rest, hydration.  You should rehydrate with fluids that contain a bit of salt and sugar, for example Gatorade or Pedialyte, and this will provide most optimal hydration.   To-Do: 1. Please follow-up with your primary doctor within 1 - 2 weeks / as soon as possible.   Please return to the Emergency Department or call 911 if you experience have worsening of your symptoms, particularly if you have worsening shortness of breath, difficulty breathing, chest pain, severe or significantly worsening pain, high fever, severe confusion, pass out or have any reason to think that you need emergency medical care.   We hope you feel better soon.   Mitzie Later, MD Department of Emergency Medicine [facility]
